# Patient Record
Sex: Male | Born: 1980 | Race: White | Hispanic: Yes | Marital: Single | Smoking: Never smoker
Health system: Southern US, Community
[De-identification: ages and names within clinical notes are randomized; demographics above are authoritative.]

---

## 2007-01-07 ENCOUNTER — Emergency Department (HOSPITAL_COMMUNITY): Admission: EM | Admit: 2007-01-07 | Discharge: 2007-01-07 | Payer: Self-pay | Admitting: Emergency Medicine

## 2013-05-22 ENCOUNTER — Ambulatory Visit: Payer: Self-pay | Admitting: Family Medicine

## 2013-05-22 VITALS — BP 112/78 | HR 62 | Temp 98.2°F | Resp 18 | Ht 63.0 in | Wt 140.6 lb

## 2013-05-22 DIAGNOSIS — R5383 Other fatigue: Secondary | ICD-10-CM

## 2013-05-22 DIAGNOSIS — IMO0001 Reserved for inherently not codable concepts without codable children: Secondary | ICD-10-CM

## 2013-05-22 DIAGNOSIS — M791 Myalgia, unspecified site: Secondary | ICD-10-CM

## 2013-05-22 DIAGNOSIS — R5381 Other malaise: Secondary | ICD-10-CM

## 2013-05-22 LAB — POCT URINALYSIS DIPSTICK
Bilirubin, UA: NEGATIVE
Ketones, UA: NEGATIVE
Leukocytes, UA: NEGATIVE
Protein, UA: NEGATIVE
Spec Grav, UA: 1.01
Urobilinogen, UA: 0.2

## 2013-05-22 NOTE — Patient Instructions (Signed)
Drink plenty of fluids, relative rest as much as possibel next few days. tylenol over the counter if needed. Recheck if not continuing to improve into this weekend, sooner if rash, fever, abdominal pain, or other new or worsening symptoms. (Return to the clinic or go to the nearest emergency room if any of your symptoms worsen or new symptoms occur). You should receive a call or letter about your lab results within the next week to 10 days.

## 2013-05-22 NOTE — Progress Notes (Signed)
Subjective:    Patient ID: Erik Webb, male    DOB: 15-Jun-1981, 32 y.o.   MRN: [id number]  HPI Erik Webb is a 32 y.o. male Pain allover body - back, shoulders, then legs, some fatigue. Started 3 days ago, worse past 2 days. Noted more soreness in am trying to get up and stretch. No fever, no congestion, cough, runny nose, stomach upset past few days, but no diarrhea, just has to use BR after eating. No n/v/diarrhea.   Urinating normally, typical color. No dark urine.   Seen by chiropractor twice last year, but no recurrent/chronic back pain.    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  NKI, no new workouts/exercise - worked out in past. Nonsmoker, rare alcohol.   Tx: otc vitamins, advil yesterday.   Review of Systems  Constitutional: Negative for fever and chills.  HENT: Negative for congestion and rhinorrhea.   Respiratory: Negative for cough.   Cardiovascular: Negative for chest pain (sore in muscle of chest initially, not now. ).  Gastrointestinal: Negative for nausea, vomiting, abdominal pain, diarrhea and anal bleeding.  Genitourinary: Negative for hematuria, decreased urine volume and difficulty urinating.  Musculoskeletal: Positive for myalgias and arthralgias.  Skin: Negative for rash.    As above.     Objective:   Physical Exam  Vitals reviewed. Constitutional: He is oriented to person, place, and time. He appears well-developed and well-nourished.  HENT:  Head: Normocephalic and atraumatic.  Right Ear: Tympanic membrane, external ear and ear canal normal.  Left Ear: Tympanic membrane, external ear and ear canal normal.  Nose: No rhinorrhea.  Mouth/Throat: Oropharynx is clear and moist and mucous membranes are normal. No oropharyngeal exudate or posterior oropharyngeal erythema.  Eyes: Conjunctivae are normal. Pupils are equal, round, and reactive to light.  Neck: Neck supple.  Cardiovascular: Normal rate, regular rhythm,  normal heart sounds and intact distal pulses.   No murmur heard. Pulmonary/Chest: Effort normal and breath sounds normal. He has no wheezes. He has no rhonchi. He has no rales.  Abdominal: Soft. There is no tenderness. There is no rebound and no guarding.  Musculoskeletal: He exhibits no edema.  From of C and LS spine, equal UE/LE strength, without apparent atrophy. Diffuse ttp over paraspinal of upper and lower back, quads>hamstrings.   Lymphadenopathy:    He has no cervical adenopathy.  Neurological: He is alert and oriented to person, place, and time.  Skin: Skin is warm and dry. No rash noted. No erythema.  Psychiatric: He has a normal mood and affect. His behavior is normal. Judgment and thought content normal.      Results for orders placed in visit on 05/22/13  POCT URINALYSIS DIPSTICK      Result Value Range   Color, UA yellow     Clarity, UA clear     Glucose, UA neg     Bilirubin, UA neg     Ketones, UA neg     Spec Grav, UA 1.010     Blood, UA neg     pH, UA 7.0     Protein, UA neg     Urobilinogen, UA 0.2     Nitrite, UA neg     Leukocytes, UA Negative     Assessment & Plan:  [name] is a 32 y.o. male Myalgia - Plan: POCT urinalysis dipstick, CK, Basic metabolic panel, CANCELED: Comprehensive metabolic panel, CANCELED: CKMB  Fatigue - Plan: POCT urinalysis dipstick, CK, Basic metabolic  panel, CANCELED: Comprehensive metabolic panel, CANCELED: CKMB  3 day hx of myalgias, diffuse, but slight improvement today.  may be related to increase work last week --> delayed onset muscle soreness, vs. early viral illness, vs. less likely rhabdo as no protein noted on U/A (but will check CPK and BMP).  - push fluids, -relative rest, tylenol otc if needed. - recheck if not continuing to improve into this weekend, sooner if rash, fever, abdominal pain, or other new or worsening symptoms. Understanding expressed.   Patient Instructions  Drink plenty of fluids, relative rest as  much as possibel next few days. tylenol over the counter if needed. Recheck if not continuing to improve into this weekend, sooner if rash, fever, abdominal pain, or other new or worsening symptoms. (Return to the clinic or go to the nearest emergency room if any of your symptoms worsen or new symptoms occur). You should receive a call or letter about your lab results within the next week to 10 days.

## 2013-05-23 LAB — BASIC METABOLIC PANEL
CO2: 30 mEq/L (ref 19–32)
Chloride: 100 mEq/L (ref 96–112)
Creat: 0.89 mg/dL (ref 0.50–1.35)
Potassium: 4.3 mEq/L (ref 3.5–5.3)
Sodium: 139 mEq/L (ref 135–145)

## 2013-05-23 LAB — CK: Total CK: 48 U/L (ref 7–232)

## 2013-10-27 ENCOUNTER — Ambulatory Visit: Payer: Self-pay | Admitting: Emergency Medicine

## 2013-10-27 VITALS — BP 116/66 | HR 58 | Temp 97.8°F | Resp 16 | Ht 63.5 in | Wt 147.4 lb

## 2013-10-27 DIAGNOSIS — M79609 Pain in unspecified limb: Secondary | ICD-10-CM

## 2013-10-27 DIAGNOSIS — M79676 Pain in unspecified toe(s): Secondary | ICD-10-CM

## 2013-10-27 DIAGNOSIS — T169XXA Foreign body in ear, unspecified ear, initial encounter: Secondary | ICD-10-CM

## 2013-10-27 NOTE — Progress Notes (Signed)
[facility] [address], [place] [address] [phone]  Date:  10/27/2013   Name:  Erik Webb   DOB:  06-30-1981   MRN:  [id number]  PCP:  No PCP Per Patient    Chief Complaint: Foreign Body in Ear and Nail Problem   History of Present Illness:  Erik Webb is a 33 y.o. very pleasant male patient who presents with the following:  Has some pain in left ear thinks he has a FB in the ear.  No pain, fever, cough or coryza.   Pain in left great toe nail. No redness or drainage.  No improvement with over the counter medications or other home remedies. Denies other complaint or health concern today.   There are no active problems to display for this patient.   No past medical history on file.  No past surgical history on file.  History  Substance Use Topics  . Smoking status: Never Smoker   . Smokeless tobacco: Not on file  . Alcohol Use: No    No family history on file.  No Known Allergies  Medication list has been reviewed and updated.  No current outpatient prescriptions on file prior to visit.   No current facility-administered medications on file prior to visit.    Review of Systems:  As per HPI, otherwise negative.    Physical Examination: Filed Vitals:   10/27/13 1717  BP: 116/66  Pulse: 58  Temp: 97.8 F (36.6 C)  Resp: 16   Filed Vitals:   10/27/13 1717  Height: 5' 3.5" (1.613 m)  Weight: 147 lb 6.4 oz (66.86 kg)   Body mass index is 25.7 kg/(m^2). Ideal Body Weight: Weight in (lb) to have BMI = 25: 143.1   GEN: WDWN, NAD, Non-toxic, Alert & Oriented x 3 HEENT: Atraumatic, Normocephalic.  FB external ear canal Ears and Nose: No external deformity. EXTR: No clubbing/cyanosis/edema  Ingrown left great toe nail. NEURO: Normal gait.  PSYCH: Normally interactive. Conversant. Not depressed or anxious appearing.  Calm demeanor.    Assessment and Plan: Ingrown nail FB ear removed with simple irrigation  Signed,  Phillips OdorJeffery  Zakkery Dorian, MD

## 2014-05-14 ENCOUNTER — Ambulatory Visit (INDEPENDENT_AMBULATORY_CARE_PROVIDER_SITE_OTHER): Payer: Self-pay | Admitting: Family Medicine

## 2014-05-14 VITALS — BP 108/80 | HR 54 | Temp 97.4°F | Resp 16 | Ht 63.0 in | Wt 151.0 lb

## 2014-05-14 DIAGNOSIS — J069 Acute upper respiratory infection, unspecified: Secondary | ICD-10-CM

## 2014-05-14 DIAGNOSIS — H6993 Unspecified Eustachian tube disorder, bilateral: Secondary | ICD-10-CM

## 2014-05-14 DIAGNOSIS — H6990 Unspecified Eustachian tube disorder, unspecified ear: Secondary | ICD-10-CM

## 2014-05-14 NOTE — Progress Notes (Signed)
   Subjective:    Patient ID: Erik Webb, male    DOB: 04-01-1981, 33 y.o.   MRN: [id number]  HPI Patient presentts today with one day history of bilateral ear pain and headache. Started after work last night. Felt congested in his nose and had difficulty breathing in his left nostril. He woke up this morning feeling better and has had no further ear pain. He has recently heard of two people who died from sudden stroke and he is concerned that something bad will happen.   He is from [place] and misses his family. He is hoping to go home for a visit soon. 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    Review of Systems No fever/chills, few muscle aches, no cough, no SOB, no headache    Objective:   Physical Exam  Vitals reviewed. Constitutional: He is oriented to person, place, and time. He appears well-developed and well-nourished.  HENT:  Head: Normocephalic and atraumatic.  Right Ear: External ear and ear canal normal. Tympanic membrane is retracted.  Left Ear: Tympanic membrane, external ear and ear canal normal.  Nose: Nose normal. Right sinus exhibits no maxillary sinus tenderness and no frontal sinus tenderness. Left sinus exhibits no maxillary sinus tenderness and no frontal sinus tenderness.  Mouth/Throat: Uvula is midline, oropharynx is clear and moist and mucous membranes are normal.  Eyes: Conjunctivae and EOM are normal.  Neck: Normal range of motion. Neck supple.  Cardiovascular: Normal rate, regular rhythm and normal heart sounds.   Pulmonary/Chest: Effort normal and breath sounds normal.  Musculoskeletal: Normal range of motion.  Lymphadenopathy:    He has no cervical adenopathy.  Neurological: He is alert and oriented to person, place, and time.  Skin: Skin is warm and dry.  Psychiatric: He has a normal mood and affect. His behavior is normal. Judgment and thought content normal.       Assessment & Plan:  1. Eustachian tube  disorder, bilateral Provided written and verbal information regarding diagnosis and treatment. Over the counter antihistamine- generic zyrtec or claritin once a day Afrin nasal spray (generic) for up to 4 days 2. Viral URI Increase fluids, NSAIDs for muscle aches RTC if no improvement in 5-7 days, sooner if worsening   Emi Belfast, FNP-BC  [facility]  05/15/2014 5:53 PM

## 2014-05-14 NOTE — Patient Instructions (Signed)
Over the counter antihistamine- generic zyrtec or claritin once a day Afrin nasal spray (generic) for up to 4 days Barotitis media ([employer]) La barotitis media es la inflamacin del odo medio. Se produce cuando el conducto auditivo (trompa de [place]) que une la parte posterior de la nariz (nasofaringe) con el tmpano, se obstruye. Esta obstruccin puede ser causada por un resfro, alergias ambientales o una infeccin en las vas respiratorias superiores. La barotitis media que no se cura puede llevar a una lesin o a la prdida auditiva barotrauma), que Sales executive a ser [employer]. INSTRUCCIONES PARA EL CUIDADO EN EL HOGAR   Tome todos los medicamentos como le indic el mdico. Los medicamentos de venta libre podrn ayudar a Advertising account planner la obstruccin del canal y a Air traffic controller el pasaje de Soil scientist.  No se introduzca nada en el odo para limpiarlo o destaparlo. Las gotas ticas no lo ayudarn.  No practique natacin ni buceo ni viaje en avin hasta que su mdico le diga que puede [place]. Si realizar estas actividades fueran necesario, puede ser til la goma de [place], que lo har tragar con frecuencia. Tambin lo ayudar si se oprime la Darene Lamer y sopla suavemente hasta destaparse los odos para equilibrar los cambios de presin. Esto fuerza el aire en las trompas de [place].  Slo tome medicamentos de venta libre o recetados para Primary school teacher, Environmental health practitioner o bajar la [place] [facility], segn las indicaciones de su mdico.  Un descongestivo puede ayudarlo a Education administrator el odo medio y Radio producer ms fcil el equilibrio de la presin. SOLICITE ATENCIN MDICA SI:  Experimenta alguna forma de mareo grave, en el que siente como si la habitacin le diera vueltas y tiene nuseas (vrtigo).  Los sntomas slo involucran un odo. SOLICITE ATENCIN MDICA DE INMEDIATO SI:   Siente un fuerte dolor de cabeza, mareos o dolor intenso en el odo.  Tiene un drenaje sanguinolento o similar a pus por el  odo.  Tiene fiebre.  Los sntomas no mejoran o empeoran. ASEGRESE DE QUE:   Comprende estas instrucciones.  Controlar su afeccin.  Recibir ayuda de inmediato si no mejora o si empeora. Document Released: 08/07/2005 Document Revised: 08/12/2013 [facility] Patient Information 2015 [place], [place]. This information is not intended to replace advice given to you by your health care provider. Make sure you discuss any questions you have with your health care provider.

## 2014-09-15 ENCOUNTER — Ambulatory Visit (INDEPENDENT_AMBULATORY_CARE_PROVIDER_SITE_OTHER): Payer: Self-pay | Admitting: Family Medicine

## 2014-09-15 VITALS — BP 124/72 | HR 65 | Temp 97.8°F | Resp 18 | Ht 63.5 in | Wt 152.0 lb

## 2014-09-15 DIAGNOSIS — H65111 Acute and subacute allergic otitis media (mucoid) (sanguinous) (serous), right ear: Secondary | ICD-10-CM

## 2014-09-15 MED ORDER — AZITHROMYCIN 250 MG PO TABS: ORAL_TABLET | ORAL | Status: DC

## 2014-09-15 NOTE — Progress Notes (Addendum)
   Subjective:  This chart was scribed for Norberto SorensonEva Osie Merkin, MD by Evon Slackerrance Branch, ED Scribe. This Patient was seen in room 05 and the patients care was started at 4:57 PM   Patient ID: Erik Webb, male    DOB: 02-Oct-1980, 34 y.o.   MRN: [id number]  Chief Complaint  Patient presents with  . Ear Pain    x1 week yesterday worse rt worse    HPI HPI Comments: Erik Edisonloy Torre is a 34 y.o. male who [facility] the [facility] complaining of new right ear pain onset 1 week. Pt states that the ear pain recently worsened 1 day ago. Pt states that he feels as if there is something in the right ear. Pt states that he does intermittently have nasal congestion. Pt states that sometimes when lying his right side his pain is worse in the right ear. Pt denies any medications PTA. Pt denies any ear drops. Denies fever, chills, sore throat or sinus pressure. Pt states that he is also working out daily to help him sleep at nights. Pt states that he was having trouble sleeping due to having to much energy at nights and thinking too much.    History reviewed. No pertinent past medical history. No current outpatient prescriptions on file prior to visit.   No current facility-administered medications on file prior to visit.   No Known Allergies  Review of Systems  Constitutional: Negative for fever and chills.  HENT: Positive for ear pain. Negative for sinus pressure and sore throat.      Objective:   BP 124/72 mmHg  Pulse 65  Temp(Src) 97.8 F (36.6 C) (Oral)  Resp 18  Ht 5' 3.5" (1.613 m)  Wt 152 lb (68.947 kg)  BMI 26.50 kg/m2  SpO2 99%   Physical Exam  Constitutional: He is oriented to person, place, and time. He appears well-developed and well-nourished. No distress.  HENT:  Head: Normocephalic and atraumatic.  Right Ear: Tympanic membrane is erythematous.  Left Ear: Tympanic membrane normal.  Right TM: pinpoint vesical on outer aspect on Tm around 10 o clock position. Erythema of  canal. Superior aspect of TM erythematous and opaque.   Eyes: Conjunctivae and EOM are normal.  Neck: Neck supple. No tracheal deviation present.  Cardiovascular: Normal rate.   Pulmonary/Chest: Effort normal. No respiratory distress.  Musculoskeletal: Normal range of motion.  Neurological: He is alert and oriented to person, place, and time.  Skin: Skin is warm and dry.  Psychiatric: He has a normal mood and affect. His behavior is normal.  Nursing note and vitals reviewed.    Assessment & Plan:   Acute mucoid otitis media of right ear Recheck w/ me in 4-5 due to atypical appears of vesicles on Rt TM. Meds ordered this encounter  Medications  . azithromycin (ZITHROMAX) 250 MG tablet    Sig: Take 2 tabs PO x 1 dose, then 1 tab PO QD x 4 days    Dispense:  6 tablet    Refill:  0    I personally performed the services described in this documentation, which was scribed in my presence. The recorded information has been reviewed and considered, and addended by me as needed.  Norberto SorensonEva Albion Weatherholtz, MD MPH

## 2014-09-15 NOTE — Patient Instructions (Signed)
We will go ahead and put you on azithromycin - use www.goodrx.com website to get coupons and find the best pharmacy. Recheck with me on Saturday or Sunday 1/30-31 8-2. Start hydrogen peroxide drops into your right ear at night - lay with your ear up for about 5 minutes after. - just use a dropper or medicine syringe to fill up the ear canal.  Start this if you ever have any itching or irritation to your ear canal. I recommend using ear coverage if around dirt, dust, and debris rather than the small ear plugs so that you don't push any foreign material down further into the canal. It is very important to your immune system that you get good rest everynight. Stop drinking fluids 2-3 hours before bed so that you do not need to get up to void overnight. If you have trouble with sleep or with allergies and nasal congestion, try taking diphenhydramine 25-50mg  at night as needed for sleep or nasal congestion from allergies.  Increase the water that you are drinking during the day when using this medication. Recommend placing bricks under the head of your bed to elevate the head slightly so that you are sleeping on a slight incline. This will help any congestion drain down to your stomach and decrease any overnight acid reflux or indigestion.  Otitis Media Otitis media is redness, soreness, and inflammation of the middle ear. Otitis media may be caused by allergies or, most commonly, by infection. Often it occurs as a complication of the common cold. SIGNS AND SYMPTOMS Symptoms of otitis media may include:  Earache.  Fever.  Ringing in your ear.  Headache.  Leakage of fluid from the ear. DIAGNOSIS To diagnose otitis media, your health care provider will examine your ear with an otoscope. This is an instrument that allows your health care provider to see into your ear in order to examine your eardrum. Your health care provider also will ask you questions about your symptoms. TREATMENT  Typically,  otitis media resolves on its own within 3-5 days. Your health care provider may prescribe medicine to ease your symptoms of pain. If otitis media does not resolve within 5 days or is recurrent, your health care provider may prescribe antibiotic medicines if he or she suspects that a bacterial infection is the cause. HOME CARE INSTRUCTIONS   If you were prescribed an antibiotic medicine, finish it all even if you start to feel better.  Take medicines only as directed by your health care provider.  Keep all follow-up visits as directed by your health care provider. SEEK MEDICAL CARE IF:  You have otitis media only in one ear, or bleeding from your nose, or both.  You notice a lump on your neck.  You are not getting better in 3-5 days.  You feel worse instead of better. SEEK IMMEDIATE MEDICAL CARE IF:   You have pain that is not controlled with medicine.  You have swelling, redness, or pain around your ear or stiffness in your neck.  You notice that part of your face is paralyzed.  You notice that the bone behind your ear (mastoid) is tender when you touch it. MAKE SURE YOU:   Understand these instructions.  Will watch your condition.  Will get help right away if you are not doing well or get worse. Document Released: 05/12/2004 Document Revised: 12/22/2013 Document Reviewed: 03/04/2013 [facility] Patient Information 2015 [place]. This information is not intended to replace advice given to you by your health care  Make sure you discuss any questions you have with your health care provider.   

## 2014-09-19 ENCOUNTER — Ambulatory Visit (INDEPENDENT_AMBULATORY_CARE_PROVIDER_SITE_OTHER): Payer: Self-pay | Admitting: Physician Assistant

## 2014-09-19 ENCOUNTER — Encounter: Payer: Self-pay | Admitting: Physician Assistant

## 2014-09-19 DIAGNOSIS — H65111 Acute and subacute allergic otitis media (mucoid) (sanguinous) (serous), right ear: Secondary | ICD-10-CM

## 2014-09-19 NOTE — Progress Notes (Signed)
   09/19/2014 at 3:17 PM  Erik EdisonEloy Webb / DOB: [birth year] / MRN: [id number]  The patient  does not have a problem list on file.  SUBJECTIVE  Chief compalaint: No chief complaint on file.   History of present illness: [name] is 34 y.o. well appearing male presenting for the follow up of right sided ear pain. He was seen in clinic about 5 days ago and prescribed Azithromycin and has one dose left. He denies ear pain and difficulty with hearing at this time.     He  has no past medical history on file.    He has a current medication list which includes the following prescription(s): azithromycin.  [name] has No Known Allergies. He  reports that he has never smoked. He does not have any smokeless tobacco history on file. He reports that he does not drink alcohol or use illicit drugs. He  has no sexual activity history on file.  The patient  has no past surgical history on file.  His family history is not on file.  Review of Systems  Constitutional: Negative.   HENT: Negative for ear pain and hearing loss.     OBJECTIVE   vitals were not taken for this visit. The patient's body mass index is unknown because there is no weight on file.  Physical Exam  HENT:  Right Ear: Hearing, tympanic membrane, external ear and ear canal normal. No drainage, swelling or tenderness. Tympanic membrane is not erythematous and not retracted. No middle ear effusion. No decreased hearing is noted.  Left Ear: Hearing, tympanic membrane, external ear and ear canal normal. No drainage, swelling or tenderness. Tympanic membrane is not erythematous and not retracted.  No middle ear effusion. No decreased hearing is noted.  Ears:    No results found for this or any previous visit (from the past 24 hour(s)).  ASSESSMENT & PLAN  Claretha Cooperloy was seen today for no specified reason.  Diagnoses and associated orders for this visit:  Acute mucoid otitis media of right ear: Appears to be resolved. Advised he finish he  antibiotic course and RTC should he have any other problems.      The patient was instructed to to call or comeback to clinic as needed, or should symptoms warrant.  Deliah BostonMichael Clark, MHS, PA-C [facility] 09/19/2014 3:17 PM

## 2015-05-19 ENCOUNTER — Ambulatory Visit (INDEPENDENT_AMBULATORY_CARE_PROVIDER_SITE_OTHER): Payer: Self-pay | Admitting: Physician Assistant

## 2015-05-19 VITALS — BP 110/76 | HR 60 | Temp 98.3°F | Resp 16 | Ht 64.0 in | Wt 152.0 lb

## 2015-05-19 DIAGNOSIS — G44209 Tension-type headache, unspecified, not intractable: Secondary | ICD-10-CM

## 2015-05-19 DIAGNOSIS — Z91048 Other nonmedicinal substance allergy status: Secondary | ICD-10-CM

## 2015-05-19 DIAGNOSIS — L259 Unspecified contact dermatitis, unspecified cause: Secondary | ICD-10-CM

## 2015-05-19 DIAGNOSIS — Z9109 Other allergy status, other than to drugs and biological substances: Secondary | ICD-10-CM

## 2015-05-19 MED ORDER — IBUPROFEN 600 MG PO TABS: 600.0000 mg | ORAL_TABLET | Freq: Four times a day (QID) | ORAL | Status: DC | PRN

## 2015-05-19 MED ORDER — CETIRIZINE HCL 10 MG PO TABS: 10.0000 mg | ORAL_TABLET | Freq: Every day | ORAL | Status: DC

## 2015-05-19 NOTE — Progress Notes (Signed)
   Subjective:    Patient ID: Erik Webb, male    DOB: [birth year], 34 y.o.   MRN: [id number]  HPI Patient presents for HA, rash on ear, and congestion.   Had sudden headache last night around 1 am that was located mostly on the left side of head and was intense. Headache resolved after taking 2 or 3 ibuprofen, but wanted to make sure that he was not having a stroke. Has friend that had a stroke and was being cautious. Denies SOB, CP, dizziness, weakness, visual change, or speech change. PMH negative and not taking any medication.   Rash on ear has been present for 2 weeks and is mostly gone, but wanted to make sure it was not anything bad. Rash itched a little initially, but no longer does. Denies pain, redness, or drainage. Was dry and bumpy. Denies using new facial wash, moisturizer, or shampoo. Has not been outside any more than usual.   Has had nasal congestion for 1 week that is accompanied with itchy eyes and some rhinorrhea. Denies fever, cough, sneezing, otalgia, sinus pressure, or sore throat. Does not smoke. Denies seasonal allergies or asthma. Has not taken any medication to remedy.  NKDA.  Review of Systems As noted above.     Objective:   Physical Exam  Constitutional: He is oriented to person, place, and time. He appears well-developed and well-nourished. No distress.  Blood pressure 110/76, pulse 60, temperature 98.3 F (36.8 C), temperature source Oral, resp. rate 16, height  (1.626 m), weight 152 lb (68.947 kg), SpO2 98 %.   HENT:  Head: Normocephalic and atraumatic.  Right Ear: External ear normal.  Left Ear: External ear normal.  Mouth/Throat: Oropharynx is clear and moist. No oropharyngeal exudate.  Eyes: Conjunctivae and EOM are normal. Pupils are equal, round, and reactive to light. Right eye exhibits no discharge. Left eye exhibits no discharge. No scleral icterus.  Neck: Normal range of motion. Neck supple. No thyromegaly present.  Cardiovascular: Normal  rate, regular rhythm, normal heart sounds and intact distal pulses.  Exam reveals no gallop and no friction rub.   No murmur heard. Pulmonary/Chest: Effort normal and breath sounds normal. No respiratory distress. He has no wheezes. He has no rales.  Musculoskeletal: Normal range of motion. He exhibits no edema or tenderness.  Lymphadenopathy:    He has no cervical adenopathy.  Neurological: He is alert and oriented to person, place, and time. He has normal reflexes. No cranial nerve deficit. He exhibits normal muscle tone. Coordination normal.  Skin: Skin is warm and dry. No rash noted. He is not diaphoretic. No erythema. No pallor.  Resolving contact dermatitis in front of left ear.  Psychiatric: He has a normal mood and affect. His behavior is normal. Judgment and thought content normal.       Assessment & Plan:  1. Tension-type headache, not intractable, unspecified chronicity pattern - ibuprofen (ADVIL,MOTRIN) 600 MG tablet; Take 1 tablet (600 mg total) by mouth every 6 (six) hours as needed.  Dispense: 30 tablet; Refill: 0  2. Environmental allergies - cetirizine (ZYRTEC) 10 MG tablet; Take 1 tablet (10 mg total) by mouth daily.  Dispense: 30 tablet; Refill: 11  3. Contact dermatitis Has resolved. Keep moisturized.    Janan Ridge PA-C  [facility] 05/25/2015 11:22 AM

## 2015-05-25 ENCOUNTER — Encounter: Payer: Self-pay | Admitting: Physician Assistant

## 2015-07-29 ENCOUNTER — Ambulatory Visit (INDEPENDENT_AMBULATORY_CARE_PROVIDER_SITE_OTHER): Payer: Self-pay | Admitting: Family Medicine

## 2015-07-29 VITALS — BP 112/70 | HR 60 | Temp 98.0°F | Resp 16 | Ht 64.0 in | Wt 154.0 lb

## 2015-07-29 DIAGNOSIS — K5900 Constipation, unspecified: Secondary | ICD-10-CM

## 2015-07-29 DIAGNOSIS — K649 Unspecified hemorrhoids: Secondary | ICD-10-CM

## 2015-07-29 MED ORDER — HYDROCORTISONE 2.5 % RE CREA: 1.0000 "application " | TOPICAL_CREAM | Freq: Two times a day (BID) | RECTAL | Status: DC

## 2015-07-29 NOTE — Progress Notes (Signed)
Erik Webb is a 34 y.o. male who presents to Urgent Care today with complaints of difficulty with bowel movements:  1.  Anal itching:  Present for past month or so. Present throughout the day. Not worse at night.  Also with constipation which he describes as difficulty stooling.  Has BM once every 2 days or so.  Drinks 1 bottle of water a day.  Eats apples and bananas but no real greens or vegetables.  No weight loss.  No abodminal pain.   PMH reviewed.  History reviewed. No pertinent past medical history. History reviewed. No pertinent past surgical history.  Medications reviewed. Current Outpatient Prescriptions  Medication Sig Dispense Refill  . ibuprofen (ADVIL,MOTRIN) 600 MG tablet Take 1 tablet (600 mg total) by mouth every 6 (six) hours as needed. 30 tablet 0  . azithromycin (ZITHROMAX) 250 MG tablet Take 2 tabs PO x 1 dose, then 1 tab PO QD x 4 days (Patient not taking: Reported on 05/19/2015) 6 tablet 0  . cetirizine (ZYRTEC) 10 MG tablet Take 1 tablet (10 mg total) by mouth daily. (Patient not taking: Reported on 07/29/2015) 30 tablet 11   No current facility-administered medications for this visit.    ROS as above otherwise neg.  No chest pain, palpitations, SOB, Fever, Chills, Abd pain, N/V/D.   Physical Exam:  BP 112/70 mmHg  Pulse 60  Temp(Src) 98 F (36.7 C) (Oral)  Resp 16  Ht 5\' 4"  (1.626 m)  Wt 154 lb (69.854 kg)  BMI 26.42 kg/m2  SpO2 98% Gen:  Alert, cooperative patient who appears stated age in no acute distress.  Vital signs reviewed. Abd:  Soft/nondistended/nontender.  Good bowel sounds throughout all four quadrants.  No masses noted.  Rectal:  Good tone.  Some stool in vault.  Small internal hemorhhoid noted.  Nontender.  Not strangulated/thrombosed.  Assessment and Plan:  1.  Hemoorhiod: - treat with anusol.  2.  Constipation:   - worsening #1 above.  - stool softeners - increase water and vegetable intake.

## 2015-07-29 NOTE — Patient Instructions (Signed)
You have a small hemorrhoid.  This is what is causing the itching.    Use the hydrocortisone cream for the next 5 days.  It will make the hemorrhoid go away.  Make sure you drink 8 glasses of water a day and eat plenty of green leafy vegetables.  Take the Colace as a stool softener.  Use this once or twice a day until you are having a normal, soft bowel movement daily.   It was good to see you today  Hemorrhoids Hemorrhoids are swollen veins around the rectum or anus. There are two types of hemorrhoids:   Internal hemorrhoids. These occur in the veins just inside the rectum. They may poke through to the outside and become irritated and painful.  External hemorrhoids. These occur in the veins outside the anus and can be felt as a painful swelling or hard lump near the anus. CAUSES  Pregnancy.   Obesity.   Constipation or diarrhea.   Straining to have a bowel movement.   Sitting for long periods on the toilet.  Heavy lifting or other activity that caused you to strain.  Anal intercourse. SYMPTOMS   Pain.   Anal itching or irritation.   Rectal bleeding.   Fecal leakage.   Anal swelling.   One or more lumps around the anus.  DIAGNOSIS  Your caregiver may be able to diagnose hemorrhoids by visual examination. Other examinations or tests that may be performed include:   Examination of the rectal area with a gloved hand (digital rectal exam).   Examination of anal canal using a small tube (scope).   A blood test if you have lost a significant amount of blood.  A test to look inside the colon (sigmoidoscopy or colonoscopy). TREATMENT Most hemorrhoids can be treated at home. However, if symptoms do not seem to be getting better or if you have a lot of rectal bleeding, your caregiver may perform a procedure to help make the hemorrhoids get smaller or remove them completely. Possible treatments include:   Placing a rubber band at the base of the hemorrhoid to  cut off the circulation (rubber band ligation).   Injecting a chemical to shrink the hemorrhoid (sclerotherapy).   Using a tool to burn the hemorrhoid (infrared light therapy).   Surgically removing the hemorrhoid (hemorrhoidectomy).   Stapling the hemorrhoid to block blood flow to the tissue (hemorrhoid stapling).  HOME CARE INSTRUCTIONS   Eat foods with fiber, such as whole grains, beans, nuts, fruits, and vegetables. Ask your doctor about taking products with added fiber in them (fibersupplements).  Increase fluid intake. Drink enough water and fluids to keep your urine clear or pale yellow.   Exercise regularly.   Go to the bathroom when you have the urge to have a bowel movement. Do not wait.   Avoid straining to have bowel movements.   Keep the anal area dry and clean. Use wet toilet paper or moist towelettes after a bowel movement.   Medicated creams and suppositories may be used or applied as directed.   Only take over-the-counter or prescription medicines as directed by your caregiver.   Take warm sitz baths for 15-20 minutes, 3-4 times a day to ease pain and discomfort.   Place ice packs on the hemorrhoids if they are tender and swollen. Using ice packs between sitz baths may be helpful.   Put ice in a plastic bag.   Place a towel between your skin and the bag.   Leave the ice  on for 15-20 minutes, 3-4 times a day.   Do not use a donut-shaped pillow or sit on the toilet for long periods. This increases blood pooling and pain.  SEEK MEDICAL CARE IF:  You have increasing pain and swelling that is not controlled by treatment or medicine.  You have uncontrolled bleeding.  You have difficulty or you are unable to have a bowel movement.  You have pain or inflammation outside the area of the hemorrhoids. MAKE SURE YOU:  Understand these instructions.  Will watch your condition.  Will get help right away if you are not doing well or get  worse.   This information is not intended to replace advice given to you by your health care provider. Make sure you discuss any questions you have with your health care provider.   Document Released: 08/04/2000 Document Revised: 07/24/2012 Document Reviewed: 06/11/2012 Elsevier Interactive Patient Education [employer]2016 Elsevier Inc.

## 2015-09-13 ENCOUNTER — Ambulatory Visit (INDEPENDENT_AMBULATORY_CARE_PROVIDER_SITE_OTHER): Payer: Self-pay | Admitting: Family Medicine

## 2015-09-13 ENCOUNTER — Ambulatory Visit (INDEPENDENT_AMBULATORY_CARE_PROVIDER_SITE_OTHER): Payer: Self-pay

## 2015-09-13 VITALS — BP 110/78 | HR 55 | Temp 98.1°F | Resp 20 | Ht 64.0 in | Wt 153.8 lb

## 2015-09-13 DIAGNOSIS — K5900 Constipation, unspecified: Secondary | ICD-10-CM

## 2015-09-13 DIAGNOSIS — R140 Abdominal distension (gaseous): Secondary | ICD-10-CM

## 2015-09-13 DIAGNOSIS — R21 Rash and other nonspecific skin eruption: Secondary | ICD-10-CM

## 2015-09-13 LAB — POCT CBC
GRANULOCYTE PERCENT: 43.3 % (ref 37–80)
HEMATOCRIT: 42.7 % — AB (ref 43.5–53.7)
HEMOGLOBIN: 15 g/dL (ref 14.1–18.1)
Lymph, poc: 2.3 (ref 0.6–3.4)
MCH: 33 pg — AB (ref 27–31.2)
MCHC: 35.1 g/dL (ref 31.8–35.4)
MCV: 94.1 fL (ref 80–97)
MID (cbc): 0.5 (ref 0–0.9)
MPV: 5.7 fL (ref 0–99.8)
PLATELET COUNT, POC: 232 10*3/uL (ref 142–424)
POC GRANULOCYTE: 2.2 (ref 2–6.9)
POC LYMPH PERCENT: 46.4 %L (ref 10–50)
POC MID %: 10.3 %M (ref 0–12)
RBC: 4.54 M/uL — AB (ref 4.69–6.13)
RDW, POC: 12.4 %
WBC: 5 10*3/uL (ref 4.6–10.2)

## 2015-09-13 LAB — GLUCOSE, POCT (MANUAL RESULT ENTRY): POC GLUCOSE: 91 mg/dL (ref 70–99)

## 2015-09-13 NOTE — Progress Notes (Signed)
Patient ID: Erik Webb, male    DOB: 18-Apr-1981  Age: 35 y.o. MRN: [id number]  Chief Complaint  Patient presents with  . Rash    face,  right side, x 3 days  . Bloated    Subjective:   35 year old male who is here complaining of abdominal bloating and discomfort that has been going on for some time. He feels bad in the morning when he gets. He has been constipated and doesn't always move his bowels when he feels like he needs to. He has a history of hemorrhoids and constipation the past. He is not having as much pain is just the discomfort from the bloating. He also developed a rash on his right side of his face. 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 However only bothered him on the right side of the cheek and left.  Current allergies, medications, problem list, past/family and social histories reviewed.  Objective:  BP 110/78 mmHg  Pulse 55  Temp(Src) 98.1 F (36.7 C) (Oral)  Resp 20  Ht  (1.626 m)  Wt 153 lb 12.8 oz (69.763 kg)  BMI 26.39 kg/m2  SpO2 98%  No acute distress. He has a fine macular rash on the right temple and cheek area. No thyromegaly. Chest clear. Heart regular without murmurs. Abdomen has normal bowel sounds. His abdomen feels full without defined masses.  Assessment & Plan:   Assessment: 1. Abdominal bloating   2. Constipation, unspecified constipation type   3. Rash of face       Plan: Get an x-ray of abdomen, CBC, TSH. Will treat the face with some cream.  Results for orders placed or performed in visit on 09/13/15  POCT CBC  Result Value Ref Range   WBC 5.0 4.6 - 10.2 K/uL   Lymph, poc 2.3 0.6 - 3.4   POC LYMPH PERCENT 46.4 10 - 50 %L   MID (cbc) 0.5 0 - 0.9   POC MID % 10.3 0 - 12 %M   POC Granulocyte 2.2 2 - 6.9   Granulocyte percent 43.3 37 - 80 %G   RBC 4.54 (A) 4.69 - 6.13 M/uL   Hemoglobin 15.0 14.1 - 18.1 g/dL   HCT, POC 11.9 (A) 14.7 - 53.7 %   MCV 94.1 80 - 97 fL   MCH, POC 33.0 (A) 27 - 31.2 pg   MCHC 35.1  31.8 - 35.4 g/dL   RDW, POC 82.9 %   Platelet Count, POC 232 142 - 424 K/uL   MPV 5.7 0 - 99.8 fL  POCT glucose (manual entry)  Result Value Ref Range   POC Glucose 91 70 - 99 mg/dl   Results for orders placed or performed in visit on 09/13/15  POCT CBC  Result Value Ref Range   WBC 5.0 4.6 - 10.2 K/uL   Lymph, poc 2.3 0.6 - 3.4   POC LYMPH PERCENT 46.4 10 - 50 %L   MID (cbc) 0.5 0 - 0.9   POC MID % 10.3 0 - 12 %M   POC Granulocyte 2.2 2 - 6.9   Granulocyte percent 43.3 37 - 80 %G   RBC 4.54 (A) 4.69 - 6.13 M/uL   Hemoglobin 15.0 14.1 - 18.1 g/dL   HCT, POC 56.2 (A) 13.0 - 53.7 %   MCV 94.1 80 - 97 fL   MCH, POC 33.0 (A) 27 - 31.2 pg   MCHC 35.1 31.8 - 35.4 g/dL   RDW, POC 86.5 %  Platelet Count, POC 232 142 - 424 K/uL   MPV 5.7 0 - 99.8 fL  POCT glucose (manual entry)  Result Value Ref Range   POC Glucose 91 70 - 99 mg/dl   X-ray reveals large stool burden with a lot of gas also. No apparent obstruction.  Orders Placed This Encounter  Procedures  . DG Abd 2 Views    Order Specific Question:  Reason for Exam (SYMPTOM  OR DIAGNOSIS REQUIRED)    Answer:  abdomen bloating, constipation    Order Specific Question:  Preferred imaging location?    Answer:  External  . TSH  . POCT CBC  . POCT glucose (manual entry)    No orders of the defined types were placed in this encounter.         Patient Instructions  Take MiraLAX 1 dose every day in a glass of water. You can take twice a day for a couple of days if necessary.  If your stool (pooping) gets too loose, then you can decrease to every other day.   If you are not getting to where you are feeling less bloated please return  Use over-the-counter hydrocortisone cream a small amount twice daily on the rash for 1 week. If the rash keeps coming back please return.  Consider changing to a different brand of facemask, because she may be allergic to something in the material of the facemask.  Return at anytime as  needed.  Because you received an x-ray today, you will receive an invoice from [facility]. Please contact [facility] at [phone] with questions or concerns regarding your invoice. Our billing staff will not be able to assist you with those questions.    Constipation, Adult Constipation is when a person has fewer than three bowel movements a week, has difficulty having a bowel movement, or has stools that are dry, hard, or larger than normal. As people grow older, constipation is more common. A low-fiber diet, not taking in enough fluids, and taking certain medicines may make constipation worse.  CAUSES   Certain medicines, such as antidepressants, pain medicine, iron supplements, antacids, and water pills.   Certain diseases, such as diabetes, irritable bowel syndrome (IBS), thyroid disease, or depression.   Not drinking enough water.   Not eating enough fiber-rich foods.   Stress or travel.   Lack of physical activity or exercise.   Ignoring the urge to have a bowel movement.   Using laxatives too much.  SIGNS AND SYMPTOMS   Having fewer than three bowel movements a week.   Straining to have a bowel movement.   Having stools that are hard, dry, or larger than normal.   Feeling full or bloated.   Pain in the lower abdomen.   Not feeling relief after having a bowel movement.  DIAGNOSIS  Your health care provider will take a medical history and perform a physical exam. Further testing may be done for severe constipation. Some tests may include:  A barium enema X-ray to examine your rectum, colon, and, sometimes, your small intestine.   A sigmoidoscopy to examine your lower colon.   A colonoscopy to examine your entire colon. TREATMENT  Treatment will depend on the severity of your constipation and what is causing it. Some dietary treatments include drinking more fluids and eating more fiber-rich foods. Lifestyle treatments may  include regular exercise. If these diet and lifestyle recommendations do not help, your health care provider may recommend taking over-the-counter laxative medicines to help you  have bowel movements. Prescription medicines may be prescribed if over-the-counter medicines do not work.  HOME CARE INSTRUCTIONS   Eat foods that have a lot of fiber, such as fruits, vegetables, whole grains, and beans.  Limit foods high in fat and processed sugars, such as french fries, hamburgers, cookies, candies, and soda.   A fiber supplement may be added to your diet if you cannot get enough fiber from foods.   Drink enough fluids to keep your urine clear or pale yellow.   Exercise regularly or as directed by your health care provider.   Go to the restroom when you have the urge to go. Do not hold it.   Only take over-the-counter or prescription medicines as directed by your health care provider. Do not take other medicines for constipation without talking to your health care provider first.  SEEK IMMEDIATE MEDICAL CARE IF:   You have bright red blood in your stool.   Your constipation lasts for more than 4 days or gets worse.   You have abdominal or rectal pain.   You have thin, pencil-like stools.   You have unexplained weight loss. MAKE SURE YOU:   Understand these instructions.  Will watch your condition.  Will get help right away if you are not doing well or get worse.   This information is not intended to replace advice given to you by your health care provider. Make sure you discuss any questions you have with your health care provider.   Document Released: 05/05/2004 Document Revised: 08/28/2014 Document Reviewed: 05/19/2013 Elsevier Interactive Patient Education 2016 [employer]. Estreimiento - Adultos (Constipation, Adult) Estreimiento significa que una persona tiene menos de tres evacuaciones en una semana, dificultad para defecar, o que las heces son secas, duras, o  ms grandes que lo normal. A medida que envejecemos el estreimiento es ms comn. Una dieta baja en fibra, no tomar suficientes lquidos y el uso de ciertos medicamentos pueden Scientist, research (life sciences).  CAUSAS   Ciertos medicamentos, como los antidepresivos, analgsicos, suplementos de hierro, anticidos y diurticos.  Algunas enfermedades, como la diabetes, el sndrome del colon irritable, enfermedad de la tiroides, o depresin.  No beber suficiente agua.  No consumir suficientes alimentos ricos en fibra.  Situaciones de estrs o viajes.  Falta de actividad fsica o de ejercicio.  Ignorar la necesidad sbita de Advertising copywriter.  Uso en exceso de laxantes. SIGNOS Y SNTOMAS   Defecar menos de tres veces por semana.  Dificultad para defecar.  Tener las heces secas y duras, o ms grandes que las normales.  Sensacin de estar lleno o hinchado.  Dolor en la parte baja del abdomen.  No sentir alivio despus de defecar. DIAGNSTICO  El mdico le har una historia clnica y un examen fsico. Pueden hacerle exmenes adicionales para el estreimiento grave. Estos estudios pueden ser:  Un radiografa con enema de bario para examinar el recto, el colon y, en algunos casos, el intestino delgado.  Una sigmoidoscopia para examinar el colon inferior.  Una colonoscopia para examinar todo el colon. TRATAMIENTO  El tratamiento depender de la gravedad del estreimiento y de la causa. Algunos tratamientos nutricionales son beber ms lquidos y comer ms alimentos ricos en fibra. El cambio en el estilo de vida incluye hacer ejercicios de [place] regular. Si estas recomendaciones para Public relations account executive dieta y en el estilo de vida no ayudan, el mdico le puede indicar el uso de laxantes de venta libre para ayudarlo a Advertising copywriter. Los medicamentos recetados se pueden  prescribir si los medicamentos de venta libre no lo [place].  INSTRUCCIONES PARA EL CUIDADO EN EL HOGAR   Consuma alimentos con alto  contenido de [place], como frutas, vegetales, cereales integrales y porotos.  Limite los alimentos procesados ricos en grasas y azcar, como las papas fritas, hamburguesas, galletas, dulces y refrescos.  Puede agregar un suplemento de fibra a su dieta si no obtiene lo suficiente de los alimentos.  Beba suficiente lquido para Photographer orina clara o de color amarillo plido.  Haga ejercicio regularmente o segn las indicaciones del mdico.  Vaya al bao cuando sienta la necesidad de ir. No se aguante las ganas.  Tome solo medicamentos de venta libre o recetados, segn las indicaciones del mdico. No tome otros medicamentos para el estreimiento sin consultarlo antes con su mdico. SOLICITE ATENCIN MDICA DE INMEDIATO SI:   Observa sangre brillante en las heces.  El estreimiento dura ms de 4 das o [place].  Siente dolor abdominal o rectal.  Las heces son delgadas como un lpiz.  Pierde peso de [place] inexplicable. ASEGRESE DE QUE:   Comprende estas instrucciones.  Controlar su afeccin.  Recibir ayuda de inmediato si no mejora o si empeora.   Esta informacin no tiene Theme park manager el consejo del mdico. Asegrese de hacerle al mdico cualquier pregunta que tenga.   Document Released: 08/27/2007 Document Revised: 08/28/2014 Elsevier Interactive Patient Education [employer]      Return if symptoms worsen or fail to improve.   Stanton Kissoon, MD 09/13/2015

## 2015-09-13 NOTE — Patient Instructions (Addendum)
Take MiraLAX 1 dose every day in a glass of water. You can take twice a day for a couple of days if necessary.  If your stool (pooping) gets too loose, then you can decrease to every other day.   If you are not getting to where you are feeling less bloated please return  Use over-the-counter hydrocortisone cream a small amount twice daily on the rash for 1 week. If the rash keeps coming back please return.  Consider changing to a different brand of facemask, because she may be allergic to something in the material of the facemask.  Return at anytime as needed.  Because you received an x-ray today, you will receive an invoice from [facility]. Please contact [facility] at [phone] with questions or concerns regarding your invoice. Our billing staff will not be able to assist you with those questions.    Constipation, Adult Constipation is when a person has fewer than three bowel movements a week, has difficulty having a bowel movement, or has stools that are dry, hard, or larger than normal. As people grow older, constipation is more common. A low-fiber diet, not taking in enough fluids, and taking certain medicines may make constipation worse.  CAUSES   Certain medicines, such as antidepressants, pain medicine, iron supplements, antacids, and water pills.   Certain diseases, such as diabetes, irritable bowel syndrome (IBS), thyroid disease, or depression.   Not drinking enough water.   Not eating enough fiber-rich foods.   Stress or travel.   Lack of physical activity or exercise.   Ignoring the urge to have a bowel movement.   Using laxatives too much.  SIGNS AND SYMPTOMS   Having fewer than three bowel movements a week.   Straining to have a bowel movement.   Having stools that are hard, dry, or larger than normal.   Feeling full or bloated.   Pain in the lower abdomen.   Not feeling relief after having a bowel movement.  DIAGNOSIS   Your health care provider will take a medical history and perform a physical exam. Further testing may be done for severe constipation. Some tests may include:  A barium enema X-ray to examine your rectum, colon, and, sometimes, your small intestine.   A sigmoidoscopy to examine your lower colon.   A colonoscopy to examine your entire colon. TREATMENT  Treatment will depend on the severity of your constipation and what is causing it. Some dietary treatments include drinking more fluids and eating more fiber-rich foods. Lifestyle treatments may include regular exercise. If these diet and lifestyle recommendations do not help, your health care provider may recommend taking over-the-counter laxative medicines to help you have bowel movements. Prescription medicines may be prescribed if over-the-counter medicines do not work.  HOME CARE INSTRUCTIONS   Eat foods that have a lot of fiber, such as fruits, vegetables, whole grains, and beans.  Limit foods high in fat and processed sugars, such as french fries, hamburgers, cookies, candies, and soda.   A fiber supplement may be added to your diet if you cannot get enough fiber from foods.   Drink enough fluids to keep your urine clear or pale yellow.   Exercise regularly or as directed by your health care provider.   Go to the restroom when you have the urge to go. Do not hold it.   Only take over-the-counter or prescription medicines as directed by your health care provider. Do not take other medicines for constipation without talking to your health  care provider first.  SEEK IMMEDIATE MEDICAL CARE IF:   You have bright red blood in your stool.   Your constipation lasts for more than 4 days or gets worse.   You have abdominal or rectal pain.   You have thin, pencil-like stools.   You have unexplained weight loss. MAKE SURE YOU:   Understand these instructions.  Will watch your condition.  Will get help right away if you  are not doing well or get worse.   This information is not intended to replace advice given to you by your health care provider. Make sure you discuss any questions you have with your health care provider.   Document Released: 05/05/2004 Document Revised: 08/28/2014 Document Reviewed: 05/19/2013 Elsevier Interactive Patient Education 2016 [employer]. Estreimiento - Adultos (Constipation, Adult) Estreimiento significa que una persona tiene menos de tres evacuaciones en una semana, dificultad para defecar, o que las heces son secas, duras, o ms grandes que lo normal. A medida que envejecemos el estreimiento es ms comn. Una dieta baja en fibra, no tomar suficientes lquidos y el uso de ciertos medicamentos pueden Scientist, research (life sciences).  CAUSAS   Ciertos medicamentos, como los antidepresivos, analgsicos, suplementos de hierro, anticidos y diurticos.  Algunas enfermedades, como la diabetes, el sndrome del colon irritable, enfermedad de la tiroides, o depresin.  No beber suficiente agua.  No consumir suficientes alimentos ricos en fibra.  Situaciones de estrs o viajes.  Falta de actividad fsica o de ejercicio.  Ignorar la necesidad sbita de Advertising copywriter.  Uso en exceso de laxantes. SIGNOS Y SNTOMAS   Defecar menos de tres veces por semana.  Dificultad para defecar.  Tener las heces secas y duras, o ms grandes que las normales.  Sensacin de estar lleno o hinchado.  Dolor en la parte baja del abdomen.  No sentir alivio despus de defecar. DIAGNSTICO  El mdico le har una historia clnica y un examen fsico. Pueden hacerle exmenes adicionales para el estreimiento grave. Estos estudios pueden ser:  Un radiografa con enema de bario para examinar el recto, el colon y, en algunos casos, el intestino delgado.  Una sigmoidoscopia para examinar el colon inferior.  Una colonoscopia para examinar todo el colon. TRATAMIENTO  El tratamiento depender de la gravedad  del estreimiento y de la causa. Algunos tratamientos nutricionales son beber ms lquidos y comer ms alimentos ricos en fibra. El cambio en el estilo de vida incluye hacer ejercicios de [place] regular. Si estas recomendaciones para Public relations account executive dieta y en el estilo de vida no ayudan, el mdico le puede indicar el uso de laxantes de venta libre para ayudarlo a Advertising copywriter. Los medicamentos recetados se pueden prescribir si los medicamentos de venta libre no lo [place].  INSTRUCCIONES PARA EL CUIDADO EN EL HOGAR   Consuma alimentos con alto contenido de [place], como frutas, vegetales, cereales integrales y porotos.  Limite los alimentos procesados ricos en grasas y azcar, como las papas fritas, hamburguesas, galletas, dulces y refrescos.  Puede agregar un suplemento de fibra a su dieta si no obtiene lo suficiente de los alimentos.  Beba suficiente lquido para Photographer orina clara o de color amarillo plido.  Haga ejercicio regularmente o segn las indicaciones del mdico.  Vaya al bao cuando sienta la necesidad de ir. No se aguante las ganas.  Tome solo medicamentos de venta libre o recetados, segn las indicaciones del mdico. No tome otros medicamentos para el estreimiento sin consultarlo antes con su mdico. SOLICITE ATENCIN MDICA DE  INMEDIATO SI:   Observa sangre brillante en las heces.  El estreimiento dura ms de 4 das o [place].  Siente dolor abdominal o rectal.  Las heces son delgadas como un lpiz.  Pierde peso de [place] inexplicable. ASEGRESE DE QUE:   Comprende estas instrucciones.  Controlar su afeccin.  Recibir ayuda de inmediato si no mejora o si empeora.   Esta informacin no tiene Theme park manager el consejo del mdico. Asegrese de hacerle al mdico cualquier pregunta que tenga.   Document Released: 08/27/2007 Document Revised: 08/28/2014 Elsevier Interactive Patient Education [employer]

## 2015-09-14 LAB — TSH: TSH: 3.171 u[IU]/mL (ref 0.350–4.500)

## 2020-07-17 ENCOUNTER — Inpatient Hospital Stay (HOSPITAL_COMMUNITY)
Admission: EM | Admit: 2020-07-17 | Discharge: 2020-07-21 | DRG: 177 | Disposition: A | Discharge status: Home / Self Care | Payer: HRSA Program | Attending: Internal Medicine | Admitting: Internal Medicine

## 2020-07-17 ENCOUNTER — Encounter (HOSPITAL_COMMUNITY): Payer: Self-pay | Admitting: Emergency Medicine

## 2020-07-17 ENCOUNTER — Emergency Department (HOSPITAL_COMMUNITY): Payer: HRSA Program

## 2020-07-17 ENCOUNTER — Other Ambulatory Visit: Payer: Self-pay

## 2020-07-17 DIAGNOSIS — E871 Hypo-osmolality and hyponatremia: Secondary | ICD-10-CM | POA: Diagnosis present

## 2020-07-17 DIAGNOSIS — Z23 Encounter for immunization: Secondary | ICD-10-CM

## 2020-07-17 DIAGNOSIS — R7401 Elevation of levels of liver transaminase levels: Secondary | ICD-10-CM | POA: Diagnosis present

## 2020-07-17 DIAGNOSIS — J1282 Pneumonia due to coronavirus disease 2019: Secondary | ICD-10-CM

## 2020-07-17 DIAGNOSIS — U071 COVID-19: Principal | ICD-10-CM | POA: Diagnosis present

## 2020-07-17 DIAGNOSIS — R509 Fever, unspecified: Secondary | ICD-10-CM | POA: Diagnosis not present

## 2020-07-17 LAB — COMPREHENSIVE METABOLIC PANEL
ALT: 51 U/L — ABNORMAL HIGH (ref 0–44)
AST: 56 U/L — ABNORMAL HIGH (ref 15–41)
Albumin: 3.3 g/dL — ABNORMAL LOW (ref 3.5–5.0)
Alkaline Phosphatase: 48 U/L (ref 38–126)
Anion gap: 11 (ref 5–15)
BUN: 7 mg/dL (ref 6–20)
CO2: 25 mmol/L (ref 22–32)
Calcium: 8.4 mg/dL — ABNORMAL LOW (ref 8.9–10.3)
Chloride: 95 mmol/L — ABNORMAL LOW (ref 98–111)
Creatinine, Ser: 0.92 mg/dL (ref 0.61–1.24)
GFR, Estimated: >60 mL/min (ref >60)
Glucose, Bld: 125 mg/dL — ABNORMAL HIGH (ref 70–99)
Potassium: 4.1 mmol/L (ref 3.5–5.1)
Sodium: 131 mmol/L — ABNORMAL LOW (ref 135–145)
Total Bilirubin: 0.5 mg/dL (ref 0.3–1.2)
Total Protein: 6.8 g/dL (ref 6.5–8.1)

## 2020-07-17 LAB — CBC WITH DIFFERENTIAL/PLATELET
Abs Immature Granulocytes: 0.08 10*3/uL — ABNORMAL HIGH (ref 0.00–0.07)
Basophils Absolute: 0 10*3/uL (ref 0.0–0.1)
Basophils Relative: 0 %
Eosinophils Absolute: 0 10*3/uL (ref 0.0–0.5)
Eosinophils Relative: 0 %
HCT: 41.3 % (ref 39.0–52.0)
Hemoglobin: 14.4 g/dL (ref 13.0–17.0)
Immature Granulocytes: 2 %
Lymphocytes Relative: 12 %
Lymphs Abs: 0.6 10*3/uL — ABNORMAL LOW (ref 0.7–4.0)
MCH: 32.5 pg (ref 26.0–34.0)
MCHC: 34.9 g/dL (ref 30.0–36.0)
MCV: 93.2 fL (ref 80.0–100.0)
Monocytes Absolute: 0.2 10*3/uL (ref 0.1–1.0)
Monocytes Relative: 4 %
Neutro Abs: 4.3 10*3/uL (ref 1.7–7.7)
Neutrophils Relative %: 82 %
Platelets: 172 10*3/uL (ref 150–400)
RBC: 4.43 MIL/uL (ref 4.22–5.81)
RDW: 11.3 % — ABNORMAL LOW (ref 11.5–15.5)
WBC: 5.3 10*3/uL (ref 4.0–10.5)
nRBC: 0 % (ref 0.0–0.2)

## 2020-07-17 LAB — FIBRINOGEN: Fibrinogen: 671 mg/dL — ABNORMAL HIGH (ref 210–475)

## 2020-07-17 LAB — C-REACTIVE PROTEIN: CRP: 10.9 mg/dL — ABNORMAL HIGH (ref <1.0)

## 2020-07-17 LAB — PROCALCITONIN: Procalcitonin: 0.11 ng/mL

## 2020-07-17 LAB — LACTATE DEHYDROGENASE: LDH: 334 U/L — ABNORMAL HIGH (ref 98–192)

## 2020-07-17 LAB — TRIGLYCERIDES: Triglycerides: 80 mg/dL (ref <150)

## 2020-07-17 LAB — LACTIC ACID, PLASMA: Lactic Acid, Venous: 1.1 mmol/L (ref 0.5–1.9)

## 2020-07-17 LAB — FERRITIN: Ferritin: 1436 ng/mL — ABNORMAL HIGH (ref 24–336)

## 2020-07-17 LAB — D-DIMER, QUANTITATIVE: D-Dimer, Quant: 0.66 ug/mL-FEU — ABNORMAL HIGH (ref 0.00–0.50)

## 2020-07-17 MED ORDER — HYDROCODONE-HOMATROPINE 5-1.5 MG/5ML PO SYRP: 5.0000 mL | ORAL_SOLUTION | ORAL | Status: DC | PRN

## 2020-07-17 MED ORDER — ACETAMINOPHEN 325 MG PO TABS
650.0000 mg | ORAL_TABLET | ORAL | Status: DC | PRN
  Administered 2020-07-17: 650 mg via ORAL
  Filled 2020-07-17: qty 2

## 2020-07-17 MED ORDER — ALBUTEROL SULFATE HFA 108 (90 BASE) MCG/ACT IN AERS
2.0000 | INHALATION_SPRAY | RESPIRATORY_TRACT | Status: DC | PRN
  Filled 2020-07-17: qty 6.7

## 2020-07-17 NOTE — ED Notes (Signed)
Placed pt on 2L O2  

## 2020-07-17 NOTE — ED Notes (Signed)
Erik Webb on pt status with pt okay

## 2020-07-17 NOTE — ED Triage Notes (Signed)
Pt reports cough, fever, chills, congestion, sore throat, and partial loss of taste and smell x 1 week.  States he was seen at a clinic today and COVID tested but told results would not be back for 3-4 days so he came here to get tested.  Taking Tylenol and drinking herbal teas.

## 2020-07-17 NOTE — ED Provider Notes (Signed)
[facility] EMERGENCY DEPARTMENT Provider Note   CSN: [id number] Arrival date & time: 07/17/20  1827     History Chief Complaint  Patient presents with  . Cough  . Sore Throat    Erik Webb is a 39 y.o. male.  HPI Patient reports he started to get sick 7 days ago.  He started getting some chills and body aches.  He then lost sense of smell and taste.  He reports about 3 days ago he started getting a lot of coughing.  He has had harsh coughing and shortness of breath with coughing.  He is gone on to get increased body aches and sweats at night.  Patient is a non-smoker and he is otherwise healthy.  Not vaccinated for Covid.    History reviewed. No pertinent past medical history.  There are no problems to display for this patient.   History reviewed. No pertinent surgical history.     No family history on file.  Social History   Tobacco Use  . Smoking status: Never Smoker  . Smokeless tobacco: Never Used  Substance Use Topics  . Alcohol use: Yes    Alcohol/week: 2.0 - 3.0 standard drinks    Types: 2 - 3 Standard drinks or equivalent per week  . Drug use: No    Home Medications Prior to Admission medications   Medication Sig Start Date End Date Taking? Authorizing Provider  azithromycin (ZITHROMAX) 250 MG tablet Take 2 tabs PO x 1 dose, then 1 tab PO QD x 4 days Patient not taking: Reported on 05/19/2015 09/15/14   Sherren Mocha, MD  cetirizine (ZYRTEC) 10 MG tablet Take 1 tablet (10 mg total) by mouth daily. Patient not taking: Reported on 07/29/2015 05/19/15   Brewington, Tishira R, PA-C  hydrocortisone (ANUSOL-HC) 2.5 % rectal cream Place 1 application rectally 2 (two) times daily. X 5 days Patient not taking: Reported on 09/13/2015 07/29/15   Tobey Grim, MD  ibuprofen (ADVIL,MOTRIN) 600 MG tablet Take 1 tablet (600 mg total) by mouth every 6 (six) hours as needed. 05/19/15   Brewington, Tishira R, PA-C    Allergies    Patient has no known  allergies.  Review of Systems   Review of Systems 10 systems reviewed and negative except as per HPI Physical Exam Updated Vital Signs BP 107/73   Pulse 92   Temp (!) 102.5 F (39.2 C) (Oral)   Resp 20   Ht 5\' 4"  (1.626 m)   Wt 70.3 kg   SpO2 99%   BMI 26.61 kg/m   Physical Exam Constitutional:      Comments: Patient is alert.  He is tachypneic.  Frequent harsh cough.  Ental status clear.  HENT:     Head: Normocephalic and atraumatic.     Mouth/Throat:     Pharynx: Oropharynx is clear.  Eyes:     Extraocular Movements: Extraocular movements intact.  Cardiovascular:     Comments: Tachycardia.  No gross rub murmur gallop. Pulmonary:     Comments: Tachypnea and harsh cough.  Crackles to the mid lung fields. Abdominal:     General: There is no distension.     Palpations: Abdomen is soft.     Tenderness: There is no abdominal tenderness. There is no guarding.  Musculoskeletal:        General: No swelling or tenderness. Normal range of motion.     Right lower leg: No edema.     Left lower leg: No edema.  Skin:    General: Skin is warm and dry.  Neurological:     General: No focal deficit present.     Mental Status: He is oriented to person, place, and time.     Coordination: Coordination normal.  Psychiatric:        Mood and Affect: Mood normal.     ED Results / Procedures / Treatments   Labs (all labs ordered are listed, but only abnormal results are displayed) Labs Reviewed  CBC WITH DIFFERENTIAL/PLATELET - Abnormal; Notable for the following components:      Result Value   RDW 11.3 (*)    Lymphs Abs 0.6 (*)    Abs Immature Granulocytes 0.08 (*)    All other components within normal limits  COMPREHENSIVE METABOLIC PANEL - Abnormal; Notable for the following components:   Sodium 131 (*)    Chloride 95 (*)    Glucose, Bld 125 (*)    Calcium 8.4 (*)    Albumin 3.3 (*)    AST 56 (*)    ALT 51 (*)    All other components within normal limits  D-DIMER,  QUANTITATIVE (NOT AT [facility]) - Abnormal; Notable for the following components:   D-Dimer, Quant 0.66 (*)    All other components within normal limits  LACTATE DEHYDROGENASE - Abnormal; Notable for the following components:   LDH 334 (*)    All other components within normal limits  FERRITIN - Abnormal; Notable for the following components:   Ferritin 1,436 (*)    All other components within normal limits  FIBRINOGEN - Abnormal; Notable for the following components:   Fibrinogen 671 (*)    All other components within normal limits  C-REACTIVE PROTEIN - Abnormal; Notable for the following components:   CRP 10.9 (*)    All other components within normal limits  RESP PANEL BY RT-PCR (FLU A&B, COVID) ARPGX2  CULTURE, BLOOD (ROUTINE X 2)  CULTURE, BLOOD (ROUTINE X 2)  LACTIC ACID, PLASMA  PROCALCITONIN  TRIGLYCERIDES  LACTIC ACID, PLASMA    EKG EKG Interpretation  Date/Time:  Saturday July 17 2020 20:29:39 EST Ventricular Rate:  105 PR Interval:    QRS Duration: 84 QT Interval:  296 QTC Calculation: 392 R Axis:   2 Text Interpretation: Sinus tachycardia Probable left atrial enlargement RSR' in V1 or V2, right VCD or RVH Baseline wander in lead(s) V1 no change Confirmed by Arby Barrette [phone]) on 07/17/2020 11:48:51 PM   Radiology DG Chest Port 1 View  Result Date: 07/17/2020 CLINICAL DATA:  Cough, fever and chills. EXAM: PORTABLE CHEST 1 VIEW COMPARISON:  None. FINDINGS: Cardiomediastinal silhouette is normal. Mediastinal contours appear intact. There is no evidence of pleural effusion or pneumothorax. Diffuse patchy bilateral airspace consolidation with lower lobe predominance. Osseous structures are without acute abnormality. Soft tissues are grossly normal. IMPRESSION: Diffuse patchy bilateral airspace consolidation with lower lobe predominance suggestive of atypical/viral pneumonia. Electronically Signed   By: Ted Mcalpine M.D.   On: 07/17/2020 20:42     Procedures Procedures (including critical care time) CRITICAL CARE Performed by: Arby Barrette   Total critical care time: 30 minutes  Critical care time was exclusive of separately billable procedures and treating other patients.  Critical care was necessary to treat or prevent imminent or life-threatening deterioration.  Critical care was time spent personally by me on the following activities: development of treatment plan with patient and/or surrogate as well as nursing, discussions with consultants, evaluation of patient's response to treatment, examination of patient,  obtaining history from patient or surrogate, ordering and performing treatments and interventions, ordering and review of laboratory studies, ordering and review of radiographic studies, pulse oximetry and re-evaluation of patient's condition. Medications Ordered in ED Medications  HYDROcodone-homatropine (HYCODAN) 5-1.5 MG/5ML syrup 5 mL (has no administration in time range)  albuterol (VENTOLIN HFA) 108 (90 Base) MCG/ACT inhaler 2 puff (has no administration in time range)  acetaminophen (TYLENOL) tablet 650 mg (650 mg Oral Given 07/17/20 2252)    ED Course  I have reviewed the triage vital signs and the nursing notes.  Pertinent labs & imaging results that were available during my care of the patient were reviewed by me and considered in my medical decision making (see chart for details).    MDM Rules/Calculators/A&P                          Erik Webb was evaluated in Emergency Department on 07/17/2020 for the symptoms described in the history of present illness. He was evaluated in the context of the global COVID-19 pandemic, which necessitated consideration that the patient might be at risk for infection with the SARS-CoV-2 virus that causes COVID-19. Institutional protocols and algorithms that pertain to the evaluation of patients at risk for COVID-19 are in a state of rapid change based on information  released by regulatory bodies including the CDC and federal and state organizations. These policies and algorithms were followed during the patient's care in the ED.  Patient presents with classic symptoms of Covid with early onset of body aches, loss of smell and taste and febrile illness.  Patient has now developed harsh cough with shortness of breath.  Patient is hypoxic to the low 90s and has diffuse pneumonia on chest x-ray.  Inflammatory markers elevated.  At this time will admit patient with hypoxia and pneumonia. Final Clinical Impression(s) / ED Diagnoses Final diagnoses:  Pneumonia due to COVID-19 virus    Rx / DC Orders ED Discharge Orders    None       Arby Barrette, MD 07/30/20 2217

## 2020-07-18 DIAGNOSIS — J1282 Pneumonia due to coronavirus disease 2019: Secondary | ICD-10-CM | POA: Diagnosis present

## 2020-07-18 DIAGNOSIS — R509 Fever, unspecified: Secondary | ICD-10-CM | POA: Diagnosis present

## 2020-07-18 DIAGNOSIS — Z23 Encounter for immunization: Secondary | ICD-10-CM | POA: Diagnosis not present

## 2020-07-18 DIAGNOSIS — U071 COVID-19: Principal | ICD-10-CM | POA: Diagnosis present

## 2020-07-18 DIAGNOSIS — E871 Hypo-osmolality and hyponatremia: Secondary | ICD-10-CM | POA: Diagnosis present

## 2020-07-18 LAB — RESP PANEL BY RT-PCR (FLU A&B, COVID) ARPGX2
Influenza A by PCR: NEGATIVE
Influenza B by PCR: NEGATIVE
SARS Coronavirus 2 by RT PCR: POSITIVE — AB

## 2020-07-18 LAB — HIV ANTIBODY (ROUTINE TESTING W REFLEX): HIV Screen 4th Generation wRfx: NONREACTIVE

## 2020-07-18 MED ORDER — SODIUM CHLORIDE 0.9 % IV SOLN
100.0000 mg | Freq: Every day | INTRAVENOUS | Status: DC
  Administered 2020-07-19 – 2020-07-21 (×3): 100 mg via INTRAVENOUS
  Filled 2020-07-18 (×3): qty 20

## 2020-07-18 MED ORDER — ACETAMINOPHEN 325 MG PO TABS
650.0000 mg | ORAL_TABLET | Freq: Four times a day (QID) | ORAL | Status: DC | PRN
  Administered 2020-07-21: 650 mg via ORAL
  Filled 2020-07-18 (×2): qty 2

## 2020-07-18 MED ORDER — HYDROCOD POLST-CPM POLST ER 10-8 MG/5ML PO SUER: 5.0000 mL | Freq: Two times a day (BID) | ORAL | Status: DC | PRN

## 2020-07-18 MED ORDER — DEXAMETHASONE SODIUM PHOSPHATE 10 MG/ML IJ SOLN
6.0000 mg | INTRAMUSCULAR | Status: DC
  Administered 2020-07-18 – 2020-07-21 (×4): 6 mg via INTRAVENOUS
  Filled 2020-07-18 (×4): qty 1

## 2020-07-18 MED ORDER — GUAIFENESIN-DM 100-10 MG/5ML PO SYRP
10.0000 mL | ORAL_SOLUTION | ORAL | Status: DC | PRN
  Administered 2020-07-18 – 2020-07-21 (×8): 10 mL via ORAL
  Filled 2020-07-18 (×8): qty 10

## 2020-07-18 MED ORDER — ALBUTEROL SULFATE HFA 108 (90 BASE) MCG/ACT IN AERS
2.0000 | INHALATION_SPRAY | Freq: Four times a day (QID) | RESPIRATORY_TRACT | Status: DC
  Administered 2020-07-18 – 2020-07-21 (×15): 2 via RESPIRATORY_TRACT
  Filled 2020-07-18: qty 6.7

## 2020-07-18 MED ORDER — SODIUM CHLORIDE 0.9 % IV SOLN
200.0000 mg | Freq: Once | INTRAVENOUS | Status: AC
  Administered 2020-07-18: 200 mg via INTRAVENOUS
  Filled 2020-07-18: qty 40

## 2020-07-18 MED ORDER — ENOXAPARIN SODIUM 40 MG/0.4ML ~~LOC~~ SOLN
40.0000 mg | SUBCUTANEOUS | Status: DC
  Administered 2020-07-18 – 2020-07-21 (×4): 40 mg via SUBCUTANEOUS
  Filled 2020-07-18 (×4): qty 0.4

## 2020-07-18 MED ORDER — INFLUENZA VAC SPLIT QUAD 0.5 ML IM SUSY
0.5000 mL | PREFILLED_SYRINGE | INTRAMUSCULAR | Status: AC
  Administered 2020-07-20: 0.5 mL via INTRAMUSCULAR
  Filled 2020-07-18: qty 0.5

## 2020-07-18 MED ORDER — SODIUM CHLORIDE 0.9 % IV SOLN: INTRAVENOUS | Status: AC

## 2020-07-18 NOTE — ED Notes (Signed)
Report given to Leanne RN

## 2020-07-18 NOTE — Progress Notes (Signed)
   NAME:  Erik Webb, MRN:  [id number], DOB:  12/16/1980, LOS: 0 ADMISSION DATE:  07/17/2020  Subjective   Pt admitted overnight.  Remains on room air this morning. Translator used on rounds. Pt notes still feeling cold and has a persistent cough. Other than that, he notes feeling tired.  Objective   Blood pressure 95/65, pulse 60, temperature 98.3 F (36.8 C), temperature source Oral, resp. rate (!) 22, height 5\' 4"  (1.626 m), weight 70.3 kg, SpO2 99 %.     Intake/Output Summary (Last 24 hours) at 07/18/2020 0558 Last data filed at 07/18/2020 0218 Gross per 24 hour  Intake 250 ml  Output --  Net 250 ml   Filed Weights   07/17/20 2253  Weight: 70.3 kg    Examination: General: acutely ill appearing CV: RRR. Extremities warm Pulm: breathing comfortably on room air. Has intermittent coughing spells with deep breathing. Cough is nonproductive. Lungs sounds clear on auscultation. GI: abd soft. Mild epigastric tenderness  Labs    CBC Latest Ref Rng & Units 07/17/2020 09/13/2015  WBC 4.0 - 10.5 K/uL 5.3 5.0  Hemoglobin 13.0 - 17.0 g/dL 09/15/2015 02.6  Hematocrit 39 - 52 % 41.3 42.7(A)  Platelets 150 - 400 K/uL 172 -   BMP Latest Ref Rng & Units 07/17/2020 05/22/2013  Glucose 70 - 99 mg/dL 07/22/2013) 84  BUN 6 - 20 mg/dL 7 11  Creatinine 588(F - 1.24 mg/dL 0.27 7.41  Sodium 2.87 - 145 mmol/L 131(L) 139  Potassium 3.5 - 5.1 mmol/L 4.1 4.3  Chloride 98 - 111 mmol/L 95(L) 100  CO2 22 - 32 mmol/L 25 30  Calcium 8.9 - 10.3 mg/dL 867) 9.9    Summary  39 yom admitted to [facility] on 11/27 for COVID-19 pneumonia.  Assessment & Plan:  Active Problems:   COVID-19  COVID 19 pneumonia. He was on room air on rounds this morning. Based on chart review, it looks like he is now on 2L and is a bit more tachypnic however remains stable. Mild transaminitis consistent with COVID 19 infection Plan -continue remdesivir and decadron (tx day #2/5) -prn robitussin for cough -maintain O2 saturations  >85% -trend labs -full blood cultures  Best practice:  CODE STATUS: Full Diet: Reg DVT for prophylaxis: lovenox Dispo: pending ongoing monitoring   12/27, MD Internal Medicine Resident PGY-2 Erik Webb Internal Medicine Residency Pager: [phone] Please use the [facility] after hours pager at [phone] after 5pm M-F and on weekends 07/18/20  5:58 AM

## 2020-07-18 NOTE — Progress Notes (Signed)
Pt arrived to unit on 2L [place], skin clean and intact. IVF infusing. Pt AOx4. Call ball within reach. Will continue to monitor. Erik Webb

## 2020-07-18 NOTE — ED Notes (Signed)
Attempted report 

## 2020-07-18 NOTE — ED Provider Notes (Signed)
Case is discussed with Dr. Gwyneth Revels of internal medicine teaching service who agrees to admit the patient.   Dione Booze, MD 07/18/20 [phone]

## 2020-07-18 NOTE — ED Notes (Signed)
Breakfast Ordered 

## 2020-07-18 NOTE — H&P (Signed)
Date: 07/18/2020               Patient Name:  Erik Webb MRN: [id number]  DOB: 12-01-1980 Age / Sex: 39 y.o., male   PCP: Patient, No Pcp Per         Medical Service: Internal Medicine Teaching Service         Attending Physician: Dr. Sandre Kitty, Elwin Mocha, MD    First Contact: Dr. Ames Dura, MD Pager: [phone]  Second Contact: Dr. Elige Radon, MD Pager: [phone]       After Hours (After 5p/  First Contact Pager: [phone]  weekends / holidays): Second Contact Pager: [phone]   Chief Complaint: Fevers and cough  History of Present Illness:   [name] is a 39 year old male with no pertinent past medical history who presented for cough and sore throat. Reports that his symptoms began 7 days ago when he started having fevers, chills, and body aches, which was followed by a partial loss of smell and taste. A few days later, he began experiencing a lot of harsh, dry coughing. He denies frequent shortness of breath, but does endorse intermittent shortness of breath after bouts of coughing. He states that he has been taking tylenol for his fevers, which has been helpful. He is a non-smoker and works as a Education administrator for residential housing. He is not vaccinated for COVID-19.  ED labs:  CBC unremarkable. CMP showing Na 131, AST 56, ALT 51. Lactate wnl. D-dimer 0.66, LDH 334, Ferritin 1436, fibrinogen 671, CRP 10.9. Procalcitonin 0.11. COVID-19 positive. Blood cultures pending. CXR showing diffuse patchy bilateral airspace consolidation with lower lobe predominance suggestive of atypical/viral pneumonia.  Meds: NONE No outpatient medications have been marked as taking for the 07/17/20 encounter [facility] Encounter).     Allergies: NKDA Allergies as of 07/17/2020  . (No Known Allergies)   History reviewed. No pertinent past medical history.  Family History: No pertinent family history.  Social History:  He is a non-smoker. Works as a Education administrator for residential housing. Drinks alcohol  occasionally.  Review of Systems: A complete ROS was negative except as per HPI.   Physical Exam: Blood pressure 103/76, pulse 84, temperature 98.3 F (36.8 C), temperature source Oral, resp. rate (!) 26, height 5\' 4"  (1.626 m), weight 70.3 kg, SpO2 97 %. Physical Exam Constitutional:      Appearance: He is well-developed. He is not ill-appearing.  HENT:     Head: Normocephalic and atraumatic.     Nose: No congestion.     Mouth/Throat:     Mouth: Mucous membranes are moist.     Pharynx: Oropharynx is clear. No oropharyngeal exudate.  Eyes:     Conjunctiva/sclera: Conjunctivae normal.     Pupils: Pupils are equal, round, and reactive to light.  Cardiovascular:     Rate and Rhythm: Normal rate and regular rhythm.     Heart sounds: Normal heart sounds. No murmur heard.  No friction rub. No gallop.   Pulmonary:     Effort: Pulmonary effort is normal. No respiratory distress.     Breath sounds: Normal breath sounds. No stridor. No wheezing, rhonchi or rales.     Comments: Tachypneic but no overt adventitious sounds noted. Abdominal:     General: Bowel sounds are normal. There is no distension.     Palpations: Abdomen is soft.     Tenderness: There is no abdominal tenderness. There is no guarding or rebound.  Musculoskeletal:     Cervical  back: Normal range of motion.  Skin:    General: Skin is warm and dry.     Findings: No rash.  Neurological:     General: No focal deficit present.     Mental Status: He is alert and oriented to person, place, and time.  Psychiatric:        Mood and Affect: Mood normal.        Behavior: Behavior normal.      EKG EKG Interpretation  Date/Time:                  Saturday July 17 2020 20:29:39 EST Ventricular Rate:         105 PR Interval:                   QRS Duration: 84 QT Interval:                 296 QTC Calculation:        392 R Axis:                         2 Text Interpretation:      Sinus tachycardia Probable left atrial  enlargement RSR' in V1 or V2, right VCD or RVH Baseline wander in lead(s) V1 no change Confirmed by Arby Barrette [phone]) on 07/17/2020 11:48:51 PM  DG Chest Port 1 View  Result Date: 07/17/2020 CLINICAL DATA:  Cough, fever and chills. EXAM: PORTABLE CHEST 1 VIEW COMPARISON:  None. FINDINGS: Cardiomediastinal silhouette is normal. Mediastinal contours appear intact. There is no evidence of pleural effusion or pneumothorax. Diffuse patchy bilateral airspace consolidation with lower lobe predominance. Osseous structures are without acute abnormality. Soft tissues are grossly normal. IMPRESSION: Diffuse patchy bilateral airspace consolidation with lower lobe predominance suggestive of atypical/viral pneumonia. Electronically Signed   By: Ted Mcalpine M.D.   On: 07/17/2020 20:42    Assessment & Plan by Problem: Active Problems:   COVID-19   Dispo: Admit patient to Inpatient with expected length of stay greater than 2 midnights.   [name] is a 39 year old male with no pertinent past medical history who presented with fevers, cough, myalgias, and partial loss of smell and taste found to be COVID-19 positive.   COVID-19 Pneumonia 7-day history of fevers, dry cough, myalgias, intermittent SHOB after bouts of coughing, and partial loss of smell and taste. D-dimer 0.66, LDH 334, Ferritin 1436, fibrinogen 671, CRP 10.9. Procalcitonin 0.11. COVID-19 positive. Blood cultures pending. CXR showing diffuse patchy bilateral airspace consolidation with lower lobe predominance suggestive of atypical/viral pneumonia. -given 1L NS -starting decadron and remdesivir -airborne precautions -oxygen supplementation as needed to keep O2 sats >90%, wean as tolerated -trend inflammatory markers -check pulse ox with ambulation tomorrow -f/u blood cultures  Mild transaminitis AST 56, ALT 51. Likely 2/2 COVID-19 infection. -monitor LFTs  Hyponatremia Na 131. Likely 2/2 COVID-19 infection. -Giving 1L  NS -repeat BMP in AM   Signed: Merrilyn Puma, MD 07/18/2020, 12:56 AM  Pager: [phone] After 5pm on weekdays and 1pm on weekends: On Call pager: [phone]

## 2020-07-19 LAB — CBC WITH DIFFERENTIAL/PLATELET
Abs Immature Granulocytes: 0.17 10*3/uL — ABNORMAL HIGH (ref 0.00–0.07)
Basophils Absolute: 0 10*3/uL (ref 0.0–0.1)
Basophils Relative: 0 %
Eosinophils Absolute: 0 10*3/uL (ref 0.0–0.5)
Eosinophils Relative: 0 %
HCT: 39.4 % (ref 39.0–52.0)
Hemoglobin: 13.7 g/dL (ref 13.0–17.0)
Immature Granulocytes: 3 %
Lymphocytes Relative: 13 %
Lymphs Abs: 0.8 10*3/uL (ref 0.7–4.0)
MCH: 32.9 pg (ref 26.0–34.0)
MCHC: 34.8 g/dL (ref 30.0–36.0)
MCV: 94.5 fL (ref 80.0–100.0)
Monocytes Absolute: 0.5 10*3/uL (ref 0.1–1.0)
Monocytes Relative: 7 %
Neutro Abs: 4.8 10*3/uL (ref 1.7–7.7)
Neutrophils Relative %: 77 %
Platelets: 168 10*3/uL (ref 150–400)
RBC: 4.17 MIL/uL — ABNORMAL LOW (ref 4.22–5.81)
RDW: 11.6 % (ref 11.5–15.5)
WBC: 6.2 10*3/uL (ref 4.0–10.5)
nRBC: 0 % (ref 0.0–0.2)

## 2020-07-19 LAB — COMPREHENSIVE METABOLIC PANEL
ALT: 102 U/L — ABNORMAL HIGH (ref 0–44)
AST: 83 U/L — ABNORMAL HIGH (ref 15–41)
Albumin: 2.9 g/dL — ABNORMAL LOW (ref 3.5–5.0)
Alkaline Phosphatase: 42 U/L (ref 38–126)
Anion gap: 11 (ref 5–15)
BUN: 11 mg/dL (ref 6–20)
CO2: 25 mmol/L (ref 22–32)
Calcium: 8.5 mg/dL — ABNORMAL LOW (ref 8.9–10.3)
Chloride: 100 mmol/L (ref 98–111)
Creatinine, Ser: 0.87 mg/dL (ref 0.61–1.24)
GFR, Estimated: >60 mL/min (ref >60)
Glucose, Bld: 135 mg/dL — ABNORMAL HIGH (ref 70–99)
Potassium: 4.3 mmol/L (ref 3.5–5.1)
Sodium: 136 mmol/L (ref 135–145)
Total Bilirubin: 0.6 mg/dL (ref 0.3–1.2)
Total Protein: 6.2 g/dL — ABNORMAL LOW (ref 6.5–8.1)

## 2020-07-19 LAB — D-DIMER, QUANTITATIVE: D-Dimer, Quant: 0.41 ug/mL-FEU (ref 0.00–0.50)

## 2020-07-19 LAB — MAGNESIUM: Magnesium: 2.4 mg/dL (ref 1.7–2.4)

## 2020-07-19 LAB — FERRITIN: Ferritin: 2045 ng/mL — ABNORMAL HIGH (ref 24–336)

## 2020-07-19 LAB — C-REACTIVE PROTEIN: CRP: 7.6 mg/dL — ABNORMAL HIGH (ref <1.0)

## 2020-07-19 LAB — PHOSPHORUS: Phosphorus: 2.8 mg/dL (ref 2.5–4.6)

## 2020-07-19 NOTE — Progress Notes (Signed)
SATURATION QUALIFICATIONS: (This note is used to comply with regulatory documentation for home oxygen)  Patient Saturations on Room Air at Rest = 92%  Patient Saturations on Room Air while Ambulating = 89%  

## 2020-07-19 NOTE — Progress Notes (Signed)
Subjective:   Overnight, no acute events.  This morning, he reports that he feels well. He continues to endorse frequent dry cough with ambulation or deep breathing. He is curious about what medications he is taking for his condition and asks about monoclonal antibody treatment. He wants to get up and walk the halls with support from nursing. He has no further questions or concerns.  Objective:  Vital signs in last 24 hours: Vitals:   07/18/20 1959 07/18/20 2324 07/19/20 0342 07/19/20 0740  BP: 115/73 110/70 114/71 107/67  Pulse: 100 75 77 80  Resp: (!) 24 20 (!) 24 20  Temp: 98.3 F (36.8 C) 98.1 F (36.7 C) 98.3 F (36.8 C) 98.2 F (36.8 C)  TempSrc: Oral Axillary Axillary Oral  SpO2: 90% 93% 93% 95%  Weight:      Height:      On room air  Intake/Output Summary (Last 24 hours) at 07/19/2020 0955 Last data filed at 07/19/2020 0800 Gross per 24 hour  Intake 340 ml  Output 700 ml  Net -360 ml   Filed Weights   07/17/20 2253  Weight: 70.3 kg  Physical Exam Vitals and nursing note reviewed.  Constitutional:      General: He is not in acute distress.    Appearance: He is ill-appearing.  Cardiovascular:     Rate and Rhythm: Normal rate and regular rhythm.     Heart sounds: Normal heart sounds. No murmur heard.   Pulmonary:     Effort: Pulmonary effort is normal. No respiratory distress.     Breath sounds: Normal breath sounds.  Abdominal:     General: Bowel sounds are normal.     Palpations: Abdomen is soft.     Tenderness: There is no abdominal tenderness.    CBC Latest Ref Rng & Units 07/19/2020 07/17/2020 09/13/2015  WBC 4.0 - 10.5 K/uL 6.2 5.3 5.0  Hemoglobin 13.0 - 17.0 g/dL 05.3 97.6 73.4  Hematocrit 39 - 52 % 39.4 41.3 42.7(A)  Platelets 150 - 400 K/uL 168 172 -   CMP Latest Ref Rng & Units 07/19/2020 07/17/2020 05/22/2013  Glucose 70 - 99 mg/dL 193(X) 902(I) 84  BUN 6 - 20 mg/dL 11 7 11   Creatinine 0.61 - 1.24 mg/dL 0.97 3.53  Sodium 135 - 145  mmol/L 136 131(L) 139  Potassium 3.5 - 5.1 mmol/L 4.3 4.1 4.3  Chloride 98 - 111 mmol/L 100 95(L) 100  CO2 22 - 32 mmol/L 25 25 30   Calcium 8.9 - 10.3 mg/dL 2.99) ) 9.9  Total Protein 6.5 - 8.1 g/dL 6.2(L) 6.8 -  Total Bilirubin 0.3 - 1.2 mg/dL 0.6 0.5 -  Alkaline Phos 38 - 126 U/L 42 48 -  AST 15 - 41 U/L 83(H) 56(H) -  ALT 0 - 44 U/L 102(H) 51(H) -   Magnesium - 2.4 Phosphorous - 2.8 Ferritin - 2,045 up from 1,4346 D-dimer - 0.41 down from 0.66 CRP - 7.6 down from 10.9 Blood cultures - NGTD in 12 hours  IMAGING: No results found.  Assessment/Plan:  Active Problems:   COVID-19  [name] is a 39 year old male with no pertinent past medical history who presented to [facility] on 11/28 with fevers, cough, myalgias, and partial loss of smell and taste found to have COVID-19 pneumonia.  #COVID-19 pneumonia, active #Transaminitis, worsening Patient currently on room air, however saturating in the low 90s at rest and endorsing frequent cough. CRP and D-dimer trending down. AST/ALT and ferritin trending up in  the setting of ongoing COVID-19 infection and remdesivir administration. Patient would benefit from continued inpatient management of his overall condition. Prior to discharge, will request nursing to ambulate patient and measure oxygen saturation to determine need for supplemental oxygen upon discharge. -Continue remdesivir (day 2/5) -Continue dexamethasone (day 2/10) -PRN Robitussin, Tuissonex, Hycodan -Albuterol inhaler Q6H -Maintain O2 saturation >85% -Daily CRP, CBC with Diff, CMP, D-dimer, Mg and Phos -Airborne and contact precautions  #VTE ppx: Lovenox #Code status: Full Code #Bowel regimen: None #IVF: None #PT/OT recs: N/A #Diet: Regular  Roylene Reason, MD 07/19/2020, 9:55 AM Pager: [phone] After 5pm on weekdays and 1pm on weekends: On Call pager [phone]

## 2020-07-20 LAB — CBC WITH DIFFERENTIAL/PLATELET
Abs Immature Granulocytes: 0.16 10*3/uL — ABNORMAL HIGH (ref 0.00–0.07)
Basophils Absolute: 0 10*3/uL (ref 0.0–0.1)
Basophils Relative: 0 %
Eosinophils Absolute: 0 10*3/uL (ref 0.0–0.5)
Eosinophils Relative: 0 %
HCT: 41 % (ref 39.0–52.0)
Hemoglobin: 14.3 g/dL (ref 13.0–17.0)
Immature Granulocytes: 3 %
Lymphocytes Relative: 25 %
Lymphs Abs: 1.2 10*3/uL (ref 0.7–4.0)
MCH: 32.7 pg (ref 26.0–34.0)
MCHC: 34.9 g/dL (ref 30.0–36.0)
MCV: 93.8 fL (ref 80.0–100.0)
Monocytes Absolute: 0.6 10*3/uL (ref 0.1–1.0)
Monocytes Relative: 12 %
Neutro Abs: 2.8 10*3/uL (ref 1.7–7.7)
Neutrophils Relative %: 60 %
Platelets: 125 10*3/uL — ABNORMAL LOW (ref 150–400)
RBC: 4.37 MIL/uL (ref 4.22–5.81)
RDW: 11.7 % (ref 11.5–15.5)
WBC: 4.8 10*3/uL (ref 4.0–10.5)
nRBC: 0 % (ref 0.0–0.2)

## 2020-07-20 LAB — COMPREHENSIVE METABOLIC PANEL
ALT: 111 U/L — ABNORMAL HIGH (ref 0–44)
AST: 68 U/L — ABNORMAL HIGH (ref 15–41)
Albumin: 3 g/dL — ABNORMAL LOW (ref 3.5–5.0)
Alkaline Phosphatase: 52 U/L (ref 38–126)
Anion gap: 13 (ref 5–15)
BUN: 14 mg/dL (ref 6–20)
CO2: 24 mmol/L (ref 22–32)
Calcium: 8.7 mg/dL — ABNORMAL LOW (ref 8.9–10.3)
Chloride: 99 mmol/L (ref 98–111)
Creatinine, Ser: 0.86 mg/dL (ref 0.61–1.24)
GFR, Estimated: >60 mL/min (ref >60)
Glucose, Bld: 109 mg/dL — ABNORMAL HIGH (ref 70–99)
Potassium: 3.2 mmol/L — ABNORMAL LOW (ref 3.5–5.1)
Sodium: 136 mmol/L (ref 135–145)
Total Bilirubin: 0.9 mg/dL (ref 0.3–1.2)
Total Protein: 6.4 g/dL — ABNORMAL LOW (ref 6.5–8.1)

## 2020-07-20 LAB — MAGNESIUM: Magnesium: 2.3 mg/dL (ref 1.7–2.4)

## 2020-07-20 LAB — C-REACTIVE PROTEIN: CRP: 3.4 mg/dL — ABNORMAL HIGH (ref <1.0)

## 2020-07-20 LAB — PHOSPHORUS: Phosphorus: 4 mg/dL (ref 2.5–4.6)

## 2020-07-20 LAB — D-DIMER, QUANTITATIVE: D-Dimer, Quant: <0.27 ug/mL-FEU (ref 0.00–0.50)

## 2020-07-20 LAB — FERRITIN: Ferritin: 1706 ng/mL — ABNORMAL HIGH (ref 24–336)

## 2020-07-20 MED ORDER — POTASSIUM CHLORIDE 20 MEQ PO PACK
40.0000 meq | PACK | Freq: Two times a day (BID) | ORAL | Status: AC
  Administered 2020-07-20 (×2): 40 meq via ORAL
  Filled 2020-07-20 (×2): qty 2

## 2020-07-20 NOTE — Progress Notes (Signed)
SATURATION QUALIFICATIONS: (This note is used to comply with regulatory documentation for home oxygen)  Patient Saturations on Room Air at Rest = 94%  Patient Saturations on Room Air while Ambulating = 90%    

## 2020-07-20 NOTE — Progress Notes (Signed)
Subjective:   Overnight, no acute events.  This morning, patient reports that he feels well while at rest, however he endorses frequent cough with exertion or deep breathing. He desires to continue hospital admission for continued management of his condition as he does not feel that he is at his baseline, and he is concerned about his ability to ambulate without having significant symptoms.  Objective:  Vital signs in last 24 hours: Vitals:   07/19/20 0740 07/19/20 1422 07/19/20 2044 07/20/20 0501  BP: 107/67 104/68 107/66 110/73  Pulse: 80 79 63 84  Resp: 20 19 20 20   Temp: 98.2 F (36.8 C) 98.5 F (36.9 C) 98.6 F (37 C) 98.3 F (36.8 C)  TempSrc: Oral Oral Oral Oral  SpO2: 95% 94% 96% 93%  Weight:      Height:      On room air  Intake/Output Summary (Last 24 hours) at 07/20/2020 1041 Last data filed at 07/20/2020 0531 Gross per 24 hour  Intake 500 ml  Output 1050 ml  Net -550 ml   Filed Weights   07/17/20 2253  Weight: 70.3 kg   Physical Exam Vitals and nursing note reviewed.  Constitutional:      General: He is not in acute distress.    Appearance: He is ill-appearing.  Cardiovascular:     Rate and Rhythm: Normal rate and regular rhythm.     Heart sounds: Normal heart sounds.  Pulmonary:     Effort: Pulmonary effort is normal. No respiratory distress.     Breath sounds: Normal breath sounds.  Abdominal:     General: Bowel sounds are normal.     Palpations: Abdomen is soft.     Tenderness: There is no abdominal tenderness.    CBC Latest Ref Rng & Units 07/20/2020 07/19/2020 07/17/2020  WBC 4.0 - 10.5 K/uL 4.8 6.2 5.3  Hemoglobin 13.0 - 17.0 g/dL 07/19/2020 35.3 29.9  Hematocrit 39 - 52 % 41.0 39.4 41.3  Platelets 150 - 400 K/uL 125(L) 168 172   CMP Latest Ref Rng & Units 07/20/2020 07/19/2020 07/17/2020  Glucose 70 - 99 mg/dL 07/19/2020) 683(M) 196(Q)  BUN 6 - 20 mg/dL 14 11 7   Creatinine 0.61 - 1.24 mg/dL 229(N 9.89  Sodium 135 - 145 mmol/L 136 136  131(L)  Potassium 3.5 - 5.1 mmol/L 3.2(L) 4.3 4.1  Chloride 98 - 111 mmol/L 99 100 95(L)  CO2 22 - 32 mmol/L 24 25 25   Calcium 8.9 - 10.3 mg/dL 2.11) 9.41) )  Total Protein 6.5 - 8.1 g/dL 6.4(L) 6.2(L) 6.8  Total Bilirubin 0.3 - 1.2 mg/dL 0.9 0.6 0.5  Alkaline Phos 38 - 126 U/L 52 42 48  AST 15 - 41 U/L 68(H) 83(H) 56(H)  ALT 0 - 44 U/L 111(H) 102(H) 51(H)   Ferritin - 1,706 down from 2,045 from 1,4346 D-dimer - <0.27 down from 0.41 from 0.66 CRP - 3.4 down from 7.6 from 10.9 Magnesium - 2.3 Phosphorous - 4.0 Blood cultures - NGTD in 3 days  IMAGING: No results found.  Assessment/Plan:  Principal Problem:   COVID-19  Erik Webb is a 39 year old male with no pertinent past medical history who presented to [facility] on 11/28 with fevers, cough, myalgias, and partial loss of smell and taste found to have COVID-19 pneumonia.  #COVID-19 pneumonia, active #Mild transaminitis, stable Patient currently on room air, however saturating in the mid to low 90s at rest and high 80s with ambulation. He continues to endorse frequent cough managed  with Robitussin as needed. CRP, ferritin and D-dimer trending down. AST and ALT remain mildly elevated in the setting of ongoing COVID-19 infection and remdesivir administration. -Continue remdesivir (day 3/5) -Continue IV dexamethasone (day 3/10) -PRN Robitussin -Albuterol inhaler Q6H -Maintain O2 saturation >85% -Daily CRP, CBC with Diff, CMP, D-dimer, Mg and Phos while admitted -Airborne and contact precautions  #VTE ppx: Lovenox #Code status: Full Code #Bowel regimen: None #IVF: None #PT/OT recs: N/A #Diet: Regular  Roylene Reason, MD 07/20/2020, 10:41 AM Pager: [phone] After 5pm on weekdays and 1pm on weekends: On Call pager [phone]

## 2020-07-20 NOTE — Discharge Summary (Addendum)
Name: Erik Webb MRN: [id number] DOB: [birth year] 39 y.o. PCP: Patient, No Pcp Per  Date of Admission: 07/17/2020  6:35 PM Date of Discharge: 07/21/2020 Attending Physician: Gust Rung, DO  Discharge Diagnosis: 1. Covid-19 infection  Discharge Medications: Allergies as of 07/21/2020   No Known Allergies     Medication List    STOP taking these medications   cetirizine 10 MG tablet Commonly known as: ZYRTEC   hydrocortisone 2.5 % rectal cream Commonly known as: Anusol-HC   ibuprofen 600 MG tablet Commonly known as: ADVIL     TAKE these medications   acetaminophen 325 MG tablet Commonly known as: TYLENOL Take 650 mg by mouth every 6 (six) hours as needed for mild pain, fever or headache.   albuterol 108 (90 Base) MCG/ACT inhaler Commonly known as: VENTOLIN HFA Inhale 2 puffs into the lungs every 2 (two) hours as needed for wheezing or shortness of breath.   guaiFENesin-dextromethorphan 100-10 MG/5ML syrup Commonly known as: ROBITUSSIN DM Take 10 mLs by mouth every 4 (four) hours as needed for cough.       Disposition and follow-up:   Mr.Erik Webb was discharged from [facility] in Stable condition.  At the hospital follow up visit please address:  1.  Shortness of breath/cough: Ensure patient has improvement in respiratory status and cough has resolved.   2. Covid vaccine: Encourage patient to get vaccinated 3 months after his infection.   3.  Labs / imaging needed at time of follow-up: CMP to monitor ALT/AST as they were mildly elevated. Ferritin, CRP  4.  Pending labs/ test needing follow-up: None  Follow-up Appointments: Recommended to follow up with PCP at [facility] Course by problem list: 1. COVID-19 pneumonia. Patient presented to ED with symptoms of cough, weakness and lack of smell/taste and found to have COVID-19 pneumonia. He was started on remdesivir and decadron on admission. He was placed on 2L O2 via  nasal cannula for brief period but quickly transitioned to room air. Patient continued to saturate in the low 90s while at rest and high 80s with ambulation and endorsed significant cough with ambulation. Throughout his hospitalization, his inflammatory markers gradually trended down and he had improvement in his symptoms. At discharge, his O2 sats was in the mid-90s on RA. His transamidinases were slightly elevated. He was sent home with an albuterol inhaler for SOB and wheezing and Robitussin DM for cough. He was instructed to isolate at home until December 9th. He will follow up with his PCP for lab work.   Discharge Vitals:   BP 110/73 (BP Location: Left Arm)    Pulse 71    Temp 98.3 F (36.8 C) (Oral)    Resp 20    Ht 5\' 4"  (1.626 m)    Wt 70.3 kg    SpO2 94%    BMI 26.61 kg/m   Pertinent Labs, Studies, and Procedures:  CMP Latest Ref Rng & Units 07/21/2020 07/20/2020 07/19/2020  Glucose 70 - 99 mg/dL 07/21/2020) 476(L) 465(K)  BUN 6 - 20 mg/dL 12 14 11   Creatinine 0.61 - 1.24 mg/dL 354(S 5.68  Sodium 135 - 145 mmol/L 137 136 136  Potassium 3.5 - 5.1 mmol/L 3.9 3.2(L) 4.3  Chloride 98 - 111 mmol/L 104 99 100  CO2 22 - 32 mmol/L 20(L) 24 25  Calcium 8.9 - 10.3 mg/dL 9.1 1.27) 5.17)  Total Protein 6.5 - 8.1 g/dL 6.3(L) 6.4(L) 6.2(L)  Total Bilirubin 0.3 -  1.2 mg/dL 0.8 0.9 0.6  Alkaline Phos 38 - 126 U/L 51 52 42  AST 15 - 41 U/L 117(H) 68(H) 83(H)  ALT 0 - 44 U/L 186(H) 111(H) 102(H)   Iron/TIBC/Ferritin/ %Sat    Component Value Date/Time   FERRITIN 1,511 (H) 07/21/2020 0042   C-Reactive Protein     Component Value Date/Time   CRP 1.5 (H) 07/21/2020 0042     Discharge Instructions: [name],   It was a pleasure taking care of you at [facility]. You were treated for your covid infection and you have made significant progress since you were admitted a few days ago. Most people continues to improve after they leave the hospital but a few patients often have worsening  symptoms so make sure to come back to the ED if you start having worsening shortness of breath, palpitations, chest pain, or cough. Please makes sure to isolate at your home until December 9th which is the last day of your 21-day isolation period.   We are sending you home with the following medicines to take as needed 1. Albuterol inhaler 2 puffs as needed for wheezing and shortness of breath 2. Robitussin DM syrup as needed for cough.   Follow up with your primary care doctor after your isolation ends.   Take care,   Dr. Kirke Corin  Signed: Steffanie Rainwater, MD 07/21/2020, 5:06 PM Pager: [phone] Internal Medicine Teaching Service

## 2020-07-21 DIAGNOSIS — R7401 Elevation of levels of liver transaminase levels: Secondary | ICD-10-CM | POA: Diagnosis present

## 2020-07-21 LAB — CBC WITH DIFFERENTIAL/PLATELET
Abs Immature Granulocytes: 0.23 10*3/uL — ABNORMAL HIGH (ref 0.00–0.07)
Basophils Absolute: 0.1 10*3/uL (ref 0.0–0.1)
Basophils Relative: 1 %
Eosinophils Absolute: 0 10*3/uL (ref 0.0–0.5)
Eosinophils Relative: 0 %
HCT: 40.8 % (ref 39.0–52.0)
Hemoglobin: 14.8 g/dL (ref 13.0–17.0)
Immature Granulocytes: 5 %
Lymphocytes Relative: 28 %
Lymphs Abs: 1.4 10*3/uL (ref 0.7–4.0)
MCH: 33.6 pg (ref 26.0–34.0)
MCHC: 36.3 g/dL — ABNORMAL HIGH (ref 30.0–36.0)
MCV: 92.7 fL (ref 80.0–100.0)
Monocytes Absolute: 0.7 10*3/uL (ref 0.1–1.0)
Monocytes Relative: 14 %
Neutro Abs: 2.6 10*3/uL (ref 1.7–7.7)
Neutrophils Relative %: 52 %
Platelets: UNDETERMINED 10*3/uL (ref 150–400)
RBC: 4.4 MIL/uL (ref 4.22–5.81)
RDW: 11.7 % (ref 11.5–15.5)
WBC: 5 10*3/uL (ref 4.0–10.5)
nRBC: 0 % (ref 0.0–0.2)

## 2020-07-21 LAB — COMPREHENSIVE METABOLIC PANEL
ALT: 186 U/L — ABNORMAL HIGH (ref 0–44)
AST: 117 U/L — ABNORMAL HIGH (ref 15–41)
Albumin: 3.1 g/dL — ABNORMAL LOW (ref 3.5–5.0)
Alkaline Phosphatase: 51 U/L (ref 38–126)
Anion gap: 13 (ref 5–15)
BUN: 12 mg/dL (ref 6–20)
CO2: 20 mmol/L — ABNORMAL LOW (ref 22–32)
Calcium: 9.1 mg/dL (ref 8.9–10.3)
Chloride: 104 mmol/L (ref 98–111)
Creatinine, Ser: 0.79 mg/dL (ref 0.61–1.24)
GFR, Estimated: >60 mL/min (ref >60)
Glucose, Bld: 116 mg/dL — ABNORMAL HIGH (ref 70–99)
Potassium: 3.9 mmol/L (ref 3.5–5.1)
Sodium: 137 mmol/L (ref 135–145)
Total Bilirubin: 0.8 mg/dL (ref 0.3–1.2)
Total Protein: 6.3 g/dL — ABNORMAL LOW (ref 6.5–8.1)

## 2020-07-21 LAB — PHOSPHORUS: Phosphorus: 3.5 mg/dL (ref 2.5–4.6)

## 2020-07-21 LAB — C-REACTIVE PROTEIN: CRP: 1.5 mg/dL — ABNORMAL HIGH (ref <1.0)

## 2020-07-21 LAB — MAGNESIUM: Magnesium: 2.3 mg/dL (ref 1.7–2.4)

## 2020-07-21 LAB — D-DIMER, QUANTITATIVE: D-Dimer, Quant: 1.82 ug/mL-FEU — ABNORMAL HIGH (ref 0.00–0.50)

## 2020-07-21 LAB — FERRITIN: Ferritin: 1511 ng/mL — ABNORMAL HIGH (ref 24–336)

## 2020-07-21 MED ORDER — GUAIFENESIN-DM 100-10 MG/5ML PO SYRP: 10.0000 mL | ORAL_SOLUTION | ORAL | 0 refills | Status: AC | PRN

## 2020-07-21 MED ORDER — ALBUTEROL SULFATE HFA 108 (90 BASE) MCG/ACT IN AERS: 2.0000 | INHALATION_SPRAY | RESPIRATORY_TRACT | 0 refills | Status: AC | PRN

## 2020-07-21 NOTE — Progress Notes (Signed)
Patient was discharged home by MD order; discharged instructions  review and give to patient with care notes and E-prescriptions at his pharmacy [facility]; IV DIC; skin intact; patient will be escorted to the car by nurse tech via wheelchair. No distress noted at discharge.

## 2020-07-21 NOTE — Discharge Instructions (Signed)
Erik Webb, it was a pleasure taking care of you at [facility]. You were treated for your covid infection and you have made significant progress since you were admitted a few days ago. Most people continues to improve after they leave the hospital but a few patients often have worsening symptoms so make sure to come back to the ED if you start having worsening shortness of breath, palpitations, chest pain, or cough. Please makes sure to isolate at your home until December 9th which is the last day of your 21-day isolation period.   We are sending you home with the following medicines to take as needed 1. Albuterol inhaler 2 puffs as needed for wheezing and shortness of breath 2. Robitussin DM syrup as needed for cough.   Follow up with your primary care doctor after your isolation ends.   Take care,   Dr. Kirke Corin

## 2020-07-21 NOTE — Progress Notes (Signed)
   Subjective:   Hospital day: 4  Overnight event: Home O2 needs assessment showed 94% O2 at rest and 90% on while ambulating yesterday. No acute events overnight. This AM, patient states he has occasional cough after he walks but his SOB has improved. He was able to walk multiple laps yesterday without much SOB. Patient denies any CP, palpitations, N/V or abd pain. Patient was advised to stay isolated at home until Dec 9 and he express understanding. He lives with his brother but they sleep in separate rooms. They use the same bathroom so he informed about precautions they can take to reduce the chance of infection his brother, who is vaccinated. Patient was adviced to get vaccinated 3 months after his infection.   Objective:  Vital signs in last 24 hours: Vitals:   07/20/20 0501 07/20/20 1323 07/20/20 2018 07/21/20 0442  BP: 110/73 110/73 113/70 101/65  Pulse: 84 71 64 80  Resp: 20 20 (!) 23 20  Temp: 98.3 F (36.8 C) 98.3 F (36.8 C) 98.3 F (36.8 C) 97.9 F (36.6 C)  TempSrc: Oral Oral Oral Oral  SpO2: 93% 94% 94% 94%  Weight:      Height:        Physical Exam  General: Pleasant, well-appearing young male laying in bed. No acute distress. Head: Normocephalic. Atraumatic. CV: RRR. No murmurs, rubs, or gallops. No LE edema Pulmonary: Lungs CTAB. Normal effort. No wheezing or rales. Mild cough with deep inspirations.  Abdominal: Soft, nontender, nondistended. Normal bowel sounds. Extremities: Palpable pulses. Normal ROM. Skin: Warm and dry. No obvious rash or lesions. Neuro: A&Ox3. Moves all extremities. Normal sensation. No focal deficit. Psych: Normal mood and affect  Assessment/Plan: Erik Webb is a 39 y.o. male with no pertinent past medical history who presented to [facility] on 11/28 with fevers, cough, myalgias, and partial loss of smell and taste found to have COVID-19 pneumonia. S/p improvement in respiratory status and ready for discharge today.  Principal Problem:    COVID-19  #COVID-19 pneumonia, active #Mild transaminitis, stable Symptoms significantly improved. He continues to endorse frequent cough managed with Robitussin as needed. CRP, ferritin trending down. D-dimer slightly up this morning. AST and ALT remain mildly elevated in the setting of ongoing COVID-19 infection and remdesivir administration.  Hemodynamically stable. On room air with sats in the mid 90s. Does not meet criteria for home O2. Ready for discharge today. --Inflammatory markers trending down --Discontinued remdesivir (day 4/5) --Discontinued IV dexamethasone (day 4/10) --Continue Albuterol inhaler PRN for wheezing, SOB --Continue Robitussin DM for cough --Isolation instruction given, ends on 12/9 (11/18-12/9) --Advised to follow up with PCP after isolation to monitor transaminitis  #VTE ppx: Lovenox #Code status: Full Code #Bowel regimen: None #IVF: None #PT/OT recs: N/A #Diet: Regular  Prior to Admission Living Arrangement: Home Anticipated Discharge Location: Home Barriers to Discharge: None Dispo: Anticipated discharge today.  Signed: Steffanie Rainwater, MD 07/21/2020, 6:37 AM  Pager: [phone] Internal Medicine Teaching Service After 5pm on weekdays and 1pm on weekends: On Call pager: [phone]

## 2020-07-22 LAB — CULTURE, BLOOD (ROUTINE X 2)
Culture: NO GROWTH
Culture: NO GROWTH
Special Requests: ADEQUATE
Special Requests: ADEQUATE

## 2020-07-29 ENCOUNTER — Other Ambulatory Visit: Payer: Self-pay | Admitting: Student

## 2022-07-18 IMAGING — DX DG CHEST 1V PORT
1 series · 1 of 1 positions shown · non-contrast
Comparison: None.

CLINICAL DATA: Cough, fever and chills.

EXAM:
PORTABLE CHEST 1 VIEW

[chest]
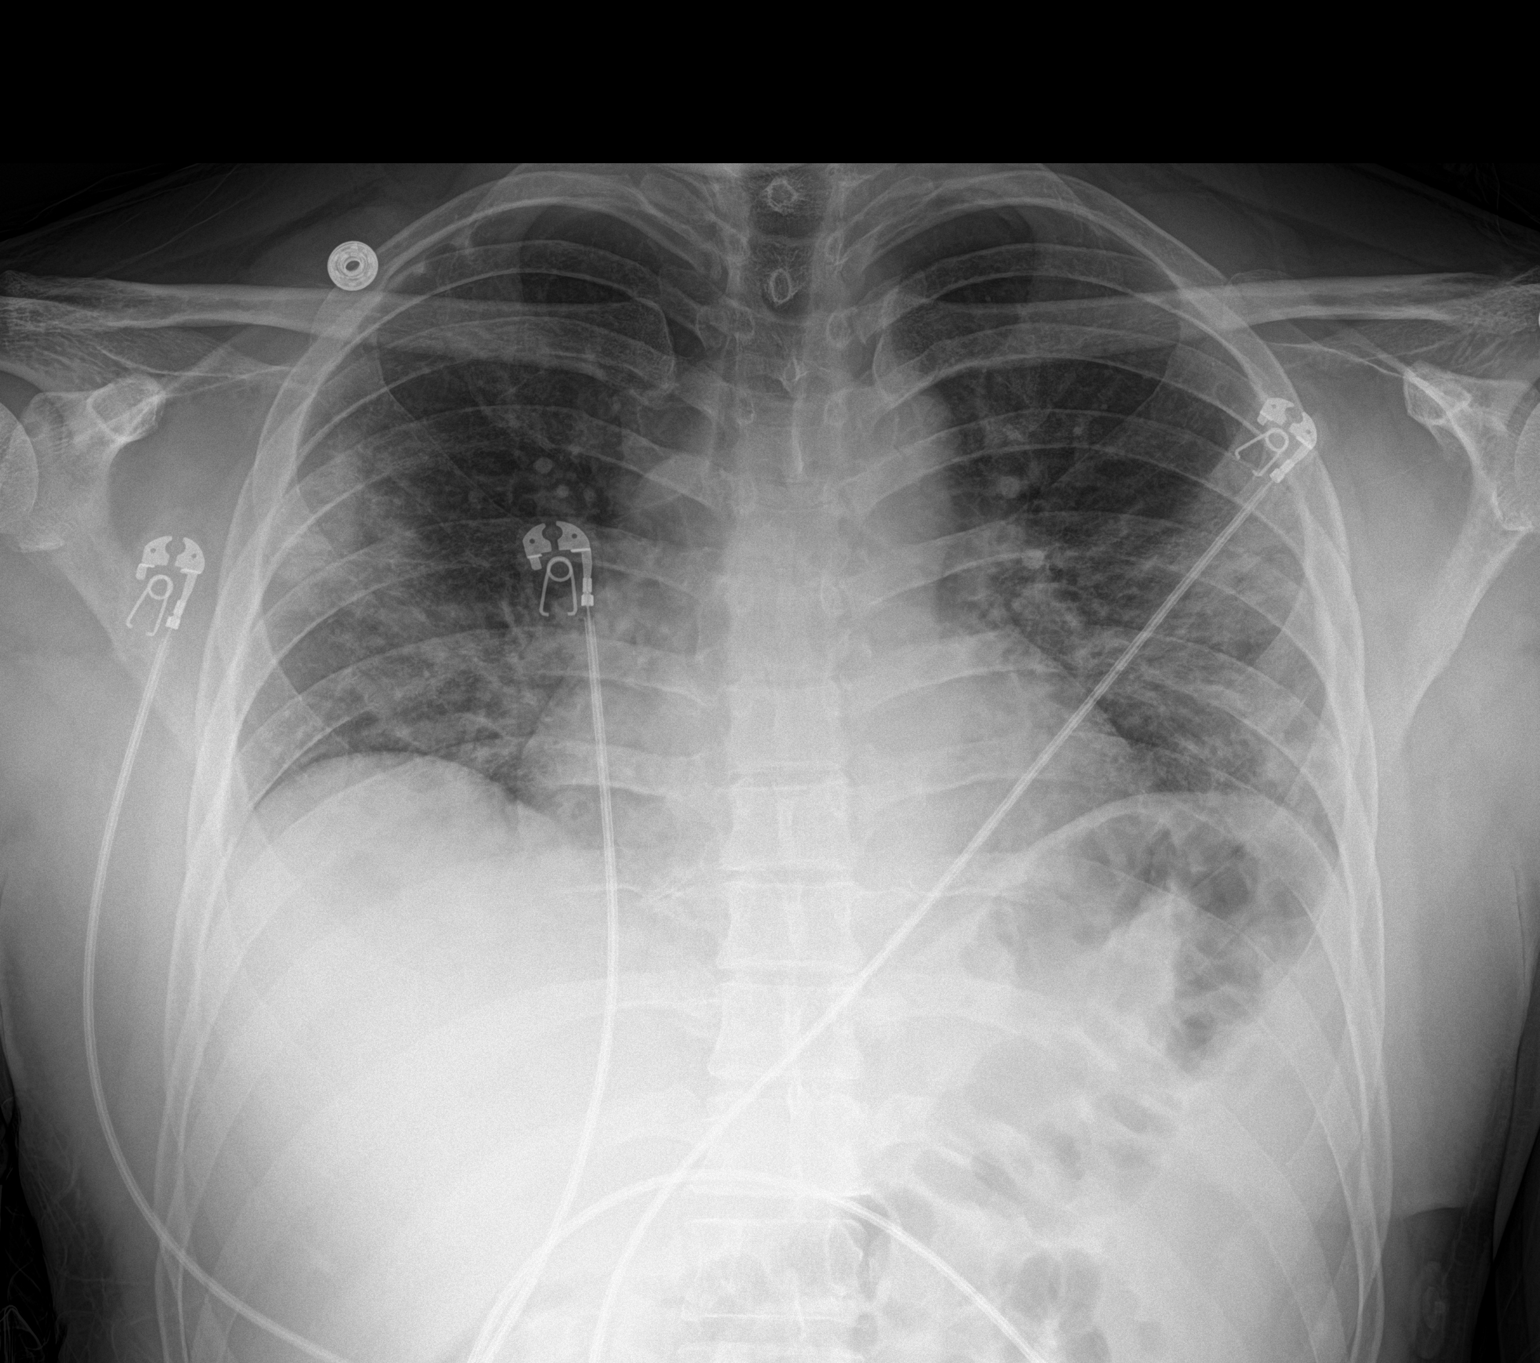

[1 of 1 positions shown; findings below may reference images not displayed]

FINDINGS: Cardiomediastinal silhouette is normal. Mediastinal contours appear
intact.

There is no evidence of pleural effusion or pneumothorax. Diffuse
patchy bilateral airspace consolidation with lower lobe
predominance.

Osseous structures are without acute abnormality. Soft tissues are
grossly normal.
IMPRESSION: Diffuse patchy bilateral airspace consolidation with lower lobe
predominance suggestive of atypical/viral pneumonia.
